# Patient Record
Sex: Female | Born: 1985
Health system: Southern US, Community
[De-identification: ages and names within clinical notes are randomized; demographics above are authoritative.]

## PROBLEM LIST (undated history)

## (undated) DIAGNOSIS — B009 Herpesviral infection, unspecified: Secondary | ICD-10-CM

## (undated) DIAGNOSIS — F419 Anxiety disorder, unspecified: Secondary | ICD-10-CM

## (undated) DIAGNOSIS — F32A Depression, unspecified: Secondary | ICD-10-CM

## (undated) DIAGNOSIS — F329 Major depressive disorder, single episode, unspecified: Secondary | ICD-10-CM

## (undated) DIAGNOSIS — N390 Urinary tract infection, site not specified: Secondary | ICD-10-CM

## (undated) DIAGNOSIS — F319 Bipolar disorder, unspecified: Secondary | ICD-10-CM

## (undated) DIAGNOSIS — T8859XA Other complications of anesthesia, initial encounter: Secondary | ICD-10-CM

## (undated) DIAGNOSIS — K219 Gastro-esophageal reflux disease without esophagitis: Secondary | ICD-10-CM

## (undated) DIAGNOSIS — T4145XA Adverse effect of unspecified anesthetic, initial encounter: Secondary | ICD-10-CM

## (undated) DIAGNOSIS — B354 Tinea corporis: Secondary | ICD-10-CM

## (undated) HISTORY — PX: WRIST SURGERY: SHX841

## (undated) HISTORY — PX: FRACTURE SURGERY: SHX138

## (undated) HISTORY — PX: ABDOMINAL HYSTERECTOMY: SHX81

---

## 2010-06-20 ENCOUNTER — Emergency Department (HOSPITAL_COMMUNITY)
Admission: EM | Admit: 2010-06-20 | Discharge: 2010-06-20 | Payer: Self-pay | Source: Home / Self Care | Admitting: Emergency Medicine

## 2010-06-22 LAB — URINALYSIS, ROUTINE W REFLEX MICROSCOPIC
Bilirubin Urine: NEGATIVE
Ketones, ur: NEGATIVE mg/dL
Nitrite: NEGATIVE
Protein, ur: 100 mg/dL — AB
Specific Gravity, Urine: 1.027 (ref 1.005–1.030)
Urine Glucose, Fasting: NEGATIVE mg/dL
Urobilinogen, UA: 0.2 mg/dL (ref 0.0–1.0)
pH: 5.5 (ref 5.0–8.0)

## 2010-06-22 LAB — URINE MICROSCOPIC-ADD ON

## 2010-06-22 LAB — POCT PREGNANCY, URINE: Preg Test, Ur: NEGATIVE

## 2010-06-27 LAB — URINE CULTURE
Colony Count: >=100000
Culture  Setup Time: 201201161321

## 2010-06-29 ENCOUNTER — Emergency Department (HOSPITAL_COMMUNITY)
Admission: EM | Admit: 2010-06-29 | Discharge: 2010-06-29 | Payer: Self-pay | Source: Home / Self Care | Admitting: Emergency Medicine

## 2010-06-29 LAB — URINALYSIS, ROUTINE W REFLEX MICROSCOPIC
Bilirubin Urine: NEGATIVE
Ketones, ur: NEGATIVE mg/dL
Leukocytes, UA: NEGATIVE
Nitrite: NEGATIVE
Protein, ur: NEGATIVE mg/dL
Specific Gravity, Urine: 1.025 (ref 1.005–1.030)
Urine Glucose, Fasting: NEGATIVE mg/dL
Urobilinogen, UA: 0.2 mg/dL (ref 0.0–1.0)
pH: 6 (ref 5.0–8.0)

## 2010-06-29 LAB — CBC
HCT: 38.4 % (ref 36.0–46.0)
Hemoglobin: 12.9 g/dL (ref 12.0–15.0)
MCH: 31.2 pg (ref 26.0–34.0)
MCHC: 33.6 g/dL (ref 30.0–36.0)
MCV: 93 fL (ref 78.0–100.0)
Platelets: 270 10*3/uL (ref 150–400)
RBC: 4.13 MIL/uL (ref 3.87–5.11)
RDW: 13.3 % (ref 11.5–15.5)
WBC: 12.5 10*3/uL — ABNORMAL HIGH (ref 4.0–10.5)

## 2010-06-29 LAB — COMPREHENSIVE METABOLIC PANEL
ALT: 10 U/L (ref 0–35)
AST: 19 U/L (ref 0–37)
Albumin: 3.7 g/dL (ref 3.5–5.2)
Alkaline Phosphatase: 48 U/L (ref 39–117)
BUN: 9 mg/dL (ref 6–23)
CO2: 28 mEq/L (ref 19–32)
Calcium: 9.5 mg/dL (ref 8.4–10.5)
Chloride: 108 mEq/L (ref 96–112)
Creatinine, Ser: 0.83 mg/dL (ref 0.4–1.2)
GFR calc Af Amer: >60 mL/min (ref >60)
GFR calc non Af Amer: >60 mL/min (ref >60)
Glucose, Bld: 111 mg/dL — ABNORMAL HIGH (ref 70–99)
Potassium: 3.9 mEq/L (ref 3.5–5.1)
Sodium: 141 mEq/L (ref 135–145)
Total Bilirubin: 0.5 mg/dL (ref 0.3–1.2)
Total Protein: 8 g/dL (ref 6.0–8.3)

## 2010-06-29 LAB — LIPASE, BLOOD: Lipase: 30 U/L (ref 11–59)

## 2010-06-29 LAB — POCT PREGNANCY, URINE: Preg Test, Ur: NEGATIVE

## 2010-06-29 LAB — DIFFERENTIAL
Basophils Absolute: 0 10*3/uL (ref 0.0–0.1)
Basophils Relative: 0 % (ref 0–1)
Eosinophils Absolute: 0.1 10*3/uL (ref 0.0–0.7)
Eosinophils Relative: 1 % (ref 0–5)
Lymphocytes Relative: 21 % (ref 12–46)
Lymphs Abs: 2.6 10*3/uL (ref 0.7–4.0)
Monocytes Absolute: 0.9 10*3/uL (ref 0.1–1.0)
Monocytes Relative: 7 % (ref 3–12)
Neutro Abs: 9 10*3/uL — ABNORMAL HIGH (ref 1.7–7.7)
Neutrophils Relative %: 72 % (ref 43–77)

## 2010-06-29 LAB — URINE MICROSCOPIC-ADD ON

## 2010-06-30 LAB — URINE CULTURE
Colony Count: NO GROWTH
Culture  Setup Time: 201201260145
Culture: NO GROWTH

## 2011-01-25 ENCOUNTER — Emergency Department (HOSPITAL_COMMUNITY)
Admission: EM | Admit: 2011-01-25 | Discharge: 2011-01-25 | Disposition: A | Discharge status: Home / Self Care | Payer: Self-pay | Attending: Emergency Medicine | Admitting: Emergency Medicine

## 2011-01-25 DIAGNOSIS — R300 Dysuria: Secondary | ICD-10-CM | POA: Insufficient documentation

## 2011-01-25 DIAGNOSIS — N390 Urinary tract infection, site not specified: Secondary | ICD-10-CM | POA: Insufficient documentation

## 2011-01-25 LAB — URINALYSIS, ROUTINE W REFLEX MICROSCOPIC
Glucose, UA: NEGATIVE mg/dL
Hgb urine dipstick: NEGATIVE
Ketones, ur: 15 mg/dL — AB
Nitrite: POSITIVE — AB
Protein, ur: NEGATIVE mg/dL
Specific Gravity, Urine: 1.016 (ref 1.005–1.030)
Urobilinogen, UA: 4 mg/dL — ABNORMAL HIGH (ref 0.0–1.0)
pH: 5 (ref 5.0–8.0)

## 2011-01-25 LAB — URINE MICROSCOPIC-ADD ON

## 2011-01-25 LAB — BASIC METABOLIC PANEL
BUN: 7 mg/dL (ref 6–23)
CO2: 26 mEq/L (ref 19–32)
Calcium: 9.1 mg/dL (ref 8.4–10.5)
Chloride: 102 mEq/L (ref 96–112)
Creatinine, Ser: 0.73 mg/dL (ref 0.50–1.10)
GFR calc Af Amer: >60 mL/min (ref >60)
GFR calc non Af Amer: >60 mL/min (ref >60)
Glucose, Bld: 92 mg/dL (ref 70–99)
Potassium: 3.6 mEq/L (ref 3.5–5.1)
Sodium: 135 mEq/L (ref 135–145)

## 2011-01-25 LAB — DIFFERENTIAL
Basophils Absolute: 0 10*3/uL (ref 0.0–0.1)
Basophils Relative: 0 % (ref 0–1)
Eosinophils Absolute: 0.1 10*3/uL (ref 0.0–0.7)
Eosinophils Relative: 1 % (ref 0–5)
Lymphocytes Relative: 24 % (ref 12–46)
Lymphs Abs: 1.7 10*3/uL (ref 0.7–4.0)
Monocytes Absolute: 1.1 10*3/uL — ABNORMAL HIGH (ref 0.1–1.0)
Monocytes Relative: 15 % — ABNORMAL HIGH (ref 3–12)
Neutro Abs: 4.2 10*3/uL (ref 1.7–7.7)
Neutrophils Relative %: 60 % (ref 43–77)

## 2011-01-25 LAB — CBC
HCT: 37 % (ref 36.0–46.0)
Hemoglobin: 12.1 g/dL (ref 12.0–15.0)
MCH: 29.9 pg (ref 26.0–34.0)
MCHC: 32.7 g/dL (ref 30.0–36.0)
MCV: 91.4 fL (ref 78.0–100.0)
Platelets: 240 10*3/uL (ref 150–400)
RBC: 4.05 MIL/uL (ref 3.87–5.11)
RDW: 13.7 % (ref 11.5–15.5)
WBC: 7 10*3/uL (ref 4.0–10.5)

## 2011-01-25 LAB — POCT PREGNANCY, URINE: Preg Test, Ur: NEGATIVE

## 2011-02-02 ENCOUNTER — Inpatient Hospital Stay (INDEPENDENT_AMBULATORY_CARE_PROVIDER_SITE_OTHER)
Admission: RE | Admit: 2011-02-02 | Discharge: 2011-02-02 | Disposition: A | Discharge status: Home / Self Care | Payer: Self-pay | Source: Ambulatory Visit | Attending: Emergency Medicine | Admitting: Emergency Medicine

## 2011-02-02 DIAGNOSIS — R599 Enlarged lymph nodes, unspecified: Secondary | ICD-10-CM

## 2011-02-02 DIAGNOSIS — N760 Acute vaginitis: Secondary | ICD-10-CM

## 2011-02-02 DIAGNOSIS — A499 Bacterial infection, unspecified: Secondary | ICD-10-CM

## 2011-02-02 LAB — POCT URINALYSIS DIP (DEVICE)
Glucose, UA: NEGATIVE mg/dL
Hgb urine dipstick: NEGATIVE
Leukocytes, UA: NEGATIVE
Nitrite: NEGATIVE
Protein, ur: NEGATIVE mg/dL
Specific Gravity, Urine: >=1.03 (ref 1.005–1.030)
Urobilinogen, UA: 0.2 mg/dL (ref 0.0–1.0)
pH: 5 (ref 5.0–8.0)

## 2011-02-02 LAB — WET PREP, GENITAL
Trich, Wet Prep: NONE SEEN
Yeast Wet Prep HPF POC: NONE SEEN

## 2011-02-03 LAB — GC/CHLAMYDIA PROBE AMP, GENITAL
Chlamydia, DNA Probe: NEGATIVE
GC Probe Amp, Genital: NEGATIVE

## 2011-02-03 LAB — HIV ANTIBODY (ROUTINE TESTING W REFLEX): HIV: NONREACTIVE

## 2011-02-03 LAB — RPR: RPR Ser Ql: NONREACTIVE

## 2011-02-06 LAB — HERPES SIMPLEX VIRUS CULTURE: Culture: DETECTED

## 2011-07-21 ENCOUNTER — Encounter (HOSPITAL_COMMUNITY): Payer: Self-pay | Admitting: Emergency Medicine

## 2011-07-21 ENCOUNTER — Emergency Department (HOSPITAL_COMMUNITY): Payer: Self-pay

## 2011-07-21 ENCOUNTER — Emergency Department (HOSPITAL_COMMUNITY)
Admission: EM | Admit: 2011-07-21 | Discharge: 2011-07-21 | Disposition: A | Discharge status: Home / Self Care | Payer: Self-pay | Attending: Emergency Medicine | Admitting: Emergency Medicine

## 2011-07-21 DIAGNOSIS — D259 Leiomyoma of uterus, unspecified: Secondary | ICD-10-CM | POA: Insufficient documentation

## 2011-07-21 DIAGNOSIS — N939 Abnormal uterine and vaginal bleeding, unspecified: Secondary | ICD-10-CM

## 2011-07-21 DIAGNOSIS — O208 Other hemorrhage in early pregnancy: Secondary | ICD-10-CM | POA: Insufficient documentation

## 2011-07-21 DIAGNOSIS — O3410 Maternal care for benign tumor of corpus uteri, unspecified trimester: Secondary | ICD-10-CM | POA: Insufficient documentation

## 2011-07-21 DIAGNOSIS — R109 Unspecified abdominal pain: Secondary | ICD-10-CM | POA: Insufficient documentation

## 2011-07-21 LAB — DIFFERENTIAL
Basophils Absolute: 0 10*3/uL (ref 0.0–0.1)
Basophils Relative: 0 % (ref 0–1)
Eosinophils Relative: 1 % (ref 0–5)
Lymphocytes Relative: 25 % (ref 12–46)
Monocytes Absolute: 0.9 10*3/uL (ref 0.1–1.0)

## 2011-07-21 LAB — POCT I-STAT, CHEM 8
BUN: 6 mg/dL (ref 6–23)
Calcium, Ion: 1.25 mmol/L (ref 1.12–1.32)
Chloride: 102 mEq/L (ref 96–112)
Creatinine, Ser: 0.7 mg/dL (ref 0.50–1.10)
Glucose, Bld: 109 mg/dL — ABNORMAL HIGH (ref 70–99)
HCT: 39 % (ref 36.0–46.0)
Hemoglobin: 13.3 g/dL (ref 12.0–15.0)
Potassium: 4.1 mEq/L (ref 3.5–5.1)
Sodium: 139 mEq/L (ref 135–145)
TCO2: 26 mmol/L (ref 0–100)

## 2011-07-21 LAB — URINALYSIS, ROUTINE W REFLEX MICROSCOPIC
Glucose, UA: NEGATIVE mg/dL
Ketones, ur: NEGATIVE mg/dL
Leukocytes, UA: NEGATIVE
Specific Gravity, Urine: 1.01 (ref 1.005–1.030)
pH: 5.5 (ref 5.0–8.0)

## 2011-07-21 LAB — CBC
HCT: 37.6 % (ref 36.0–46.0)
MCHC: 33 g/dL (ref 30.0–36.0)
MCV: 90.8 fL (ref 78.0–100.0)
RDW: 13.9 % (ref 11.5–15.5)

## 2011-07-21 LAB — WET PREP, GENITAL
Trich, Wet Prep: NONE SEEN
Yeast Wet Prep HPF POC: NONE SEEN

## 2011-07-21 MED ORDER — PRENATAL COMPLETE 14-0.4 MG PO TABS: 1.0000 | ORAL_TABLET | Freq: Every day | ORAL | Status: DC

## 2011-07-21 NOTE — ED Notes (Signed)
Called Alexis Meyer to check on status of Alexis Meyer. Reporting another patient ahead of pt

## 2011-07-21 NOTE — ED Notes (Signed)
Pt reporting hungry. EDP reporting pt can eat

## 2011-07-21 NOTE — ED Notes (Signed)
Patient transported to Ultrasound 

## 2011-07-21 NOTE — ED Notes (Signed)
7 week ob and she started to bleeding  today

## 2011-07-21 NOTE — ED Provider Notes (Signed)
History     CSN: [id number]  Arrival date & time 07/21/11  1208   First MD Initiated Contact with Patient 07/21/11 1219      Chief Complaint  Patient presents with  . Vaginal Bleeding    (Consider location/radiation/quality/duration/timing/severity/associated sxs/prior treatment) Patient is a 26 y.o. female presenting with vaginal bleeding. The history is provided by the patient.  Vaginal Bleeding This is a new problem. The current episode started today. The problem occurs constantly. The problem has been unchanged. Associated symptoms include abdominal pain. Pertinent negatives include no chills, fever, nausea, vomiting or weakness. The symptoms are aggravated by nothing.  Pt states she is about [redacted] wks pregnant by home pregnancy test, has not been evaluated yet. States today while sitting upstairs while her father is in surgery, she developed vaginal bleeding. She reports lower abdominal cramping as well, states "feels like my period." Denies any injuries, denies nausea, vomiting, fever, chills, urinary symptoms. No prior pregnancies. Denies any other complaints.   History reviewed. No pertinent past medical history.  Past Surgical History  Procedure Date  . Wrist surgery     No family history on file.  History  Substance Use Topics  . Smoking status: Never Smoker   . Smokeless tobacco: Not on file  . Alcohol Use: No    OB History    Grav Para Term Preterm Abortions TAB SAB Ect Mult Living                  Review of Systems  Constitutional: Negative for fever and chills.  HENT: Negative.   Eyes: Negative.   Respiratory: Negative.   Cardiovascular: Negative.   Gastrointestinal: Positive for abdominal pain. Negative for nausea and vomiting.  Genitourinary: Positive for vaginal bleeding and pelvic pain. Negative for dysuria.  Musculoskeletal: Negative.   Skin: Negative.   Neurological: Negative.  Negative for weakness.  Psychiatric/Behavioral: Negative.      Allergies  Review of patient's allergies indicates no known allergies.  Home Medications  No current outpatient prescriptions on file.  BP 136/67  Pulse 79  Temp(Src) 98.7 F (37.1 C) (Oral)  Resp 14  SpO2 100%  Physical Exam  Constitutional: She is oriented to person, place, and time. She appears well-developed and well-nourished.       tearful  HENT:  Head: Normocephalic and atraumatic.  Eyes: Conjunctivae are normal.  Neck: Neck supple.  Cardiovascular: Normal rate, regular rhythm and normal heart sounds.   Pulmonary/Chest: Effort normal and breath sounds normal. No respiratory distress.  Abdominal: Soft. Bowel sounds are normal. She exhibits no distension. There is no tenderness.  Genitourinary:       Normal external genetalia. Small amount of brown discharge mixed with blood in the vaginal canal. Cervix closed, normal appearing. No  CMT. No adnexal tenderness, no masses palpated  Musculoskeletal: Normal range of motion.  Neurological: She is alert and oriented to person, place, and time.  Skin: Skin is warm.  Psychiatric: She has a normal mood and affect.    ED Course  Procedures (including critical care time) Pt with vaginal bleeding on exam, positive pregnancy, estimated about 7 wks by LMP. Will get [place] to r/o ectopic.  Results for orders placed during the hospital encounter of 07/21/11  CBC      Component Value Range   WBC 10.3  4.0 - 10.5 (K/uL)   RBC 4.14  3.87 - 5.11 (MIL/uL)   Hemoglobin 12.4  12.0 - 15.0 (g/dL)   HCT 82.9  56.2 -  46.0 (%)   MCV 90.8  78.0 - 100.0 (fL)   MCH 30.0  26.0 - 34.0 (pg)   MCHC 33.0  30.0 - 36.0 (g/dL)   RDW 56.2  13.0 - 86.5 (%)   Platelets 253  150 - 400 (K/uL)  DIFFERENTIAL      Component Value Range   Neutrophils Relative 66  43 - 77 (%)   Neutro Abs 6.7  1.7 - 7.7 (K/uL)   Lymphocytes Relative 25  12 - 46 (%)   Lymphs Abs 2.6  0.7 - 4.0 (K/uL)   Monocytes Relative 9  3 - 12 (%)   Monocytes Absolute 0.9  0.1 - 1.0  (K/uL)   Eosinophils Relative 1  0 - 5 (%)   Eosinophils Absolute 0.1  0.0 - 0.7 (K/uL)   Basophils Relative 0  0 - 1 (%)   Basophils Absolute 0.0  0.0 - 0.1 (K/uL)  HCG, QUANTITATIVE, PREGNANCY      Component Value Range   hCG, Beta Chain, Quant, S 8059 (*) <5 (mIU/mL)  ABO/RH      Component Value Range   ABO/RH(D) O POS    PREGNANCY, URINE      Component Value Range   Preg Test, Ur POSITIVE (*) NEGATIVE   URINALYSIS, ROUTINE W REFLEX MICROSCOPIC      Component Value Range   Color, Urine YELLOW  YELLOW    APPearance CLEAR  CLEAR    Specific Gravity, Urine 1.010  1.005 - 1.030    pH 5.5  5.0 - 8.0    Glucose, UA NEGATIVE  NEGATIVE (mg/dL)   Hgb urine dipstick MODERATE (*) NEGATIVE    Bilirubin Urine NEGATIVE  NEGATIVE    Ketones, ur NEGATIVE  NEGATIVE (mg/dL)   Protein, ur NEGATIVE  NEGATIVE (mg/dL)   Urobilinogen, UA 0.2  0.0 - 1.0 (mg/dL)   Nitrite NEGATIVE  NEGATIVE    Leukocytes, UA NEGATIVE  NEGATIVE   WET PREP, GENITAL      Component Value Range   Yeast Wet Prep HPF POC NONE SEEN  NONE SEEN    Trich, Wet Prep NONE SEEN  NONE SEEN    Clue Cells Wet Prep HPF POC NONE SEEN  NONE SEEN    WBC, Wet Prep HPF POC FEW (*) NONE SEEN   POCT I-STAT, CHEM 8      Component Value Range   Sodium 139  135 - 145 (mEq/L)   Potassium 4.1  3.5 - 5.1 (mEq/L)   Chloride 102  96 - 112 (mEq/L)   BUN 6  6 - 23 (mg/dL)   Creatinine, Ser 7.84  0.50 - 1.10 (mg/dL)   Glucose, Bld 696 (*) 70 - 99 (mg/dL)   Calcium, Ion 2.95  2.84 - 1.32 (mmol/L)   TCO2 26  0 - 100 (mmol/L)   Hemoglobin 13.3  12.0 - 15.0 (g/dL)   HCT 13.2  44.0 - 10.2 (%)  URINE MICROSCOPIC-ADD ON      Component Value Range   Squamous Epithelial / LPF FEW (*) RARE    WBC, UA 0-2  <3 (WBC/hpf)   RBC / HPF 0-2  <3 (RBC/hpf)   Bacteria, UA RARE  RARE     Pelvic US pending to r/o ectopic pregnancy. Pt in no distress, VS normal. No pain. Signed out to the next team at shift change  No results found.   No diagnosis  found.    MDM          Myriam Jacobson  [place], [place] 07/22/11 [phone]

## 2011-07-21 NOTE — Discharge Instructions (Signed)
Your ultrasound showed that you are about 6 weeks, 4 days pregnant and the pregnancy is inside the uterus. You did have a fibroid that was seen on the ultrasound which may be causing your bleeding. It is important for you to follow up with your OB/GYN early next week for a recheck - if you are unable to make an appointment with him/her, please see the attached women's health resources. If you are having heavy bleeding, notice vaginal discharge, or are having any other worrisome symptoms, please return to the ER at [facility].  RESOURCE GUIDE  Dental Problems  Patients with Medicaid: [facility] [phone] [address]                                           [phone] [address] Phone:  [phone]                                                  Phone:  [phone]  If unable to pay or uninsured, contact:  [facility] or [facility]. to become qualified for the adult dental clinic.  Chronic Pain Problems Contact [facility]  [phone] Patients need to be referred by their primary care doctor.  Insufficient Money for Medicine Contact United Way:  call "211" or [facility] [phone].  No Primary Care Doctor Call [facility]  [phone] Other agencies that provide inexpensive medical care    [facility]  [phone]    [facility]  [phone]    [facility]  [phone]    [facility]  [phone]    [facility]  [phone]    [facility]  [phone]  Psychological Services [facility]  [phone] [facility]  [phone] [facility]   [phone] (emergency services [phone])  Substance Abuse Resources [facility]  [phone] [facility] [phone] [facility] [phone] Floydene Flock [phone] Residential & Outpatient Substance Abuse Program   [phone]  Abuse/Neglect [facility] Child Abuse Hotline (838) [phone] [facility] Child Abuse Hotline [phone] (After Hours)  Emergency Shelter [facility] [phone]  Maternity Homes [facility] [phone] [facility] ([phone]  MRSA Hotline #:   (276)202-6648    [facility] Resources  [facility]     [employer]                          [facility] [address] [place]                       [address]      [address] [place] [address]  [address]  Cristobal Goldmann Phone:  [phone]                                   Phone:  [phone]                 Phone:  [phone]  [facility] Mental Health Phone:  [phone]  [facility] Child Abuse Hotline (905)114-1828 (306)321-4781 (After Hours)  Fibroids Fibroids are lumps (tumors) that can occur any place in a woman's body. These lumps are not cancerous. Fibroids vary in size, weight, and where they grow. HOME CARE  Do not take aspirin.   Write down the number of pads or tampons you use during your period. Tell your doctor. This can help determine the best treatment for you.  GET HELP RIGHT AWAY IF:  You have pain in your lower belly (abdomen) that is not helped with medicine.   You have cramps that are not helped with medicine.   You have more bleeding between or during your period.   You feel lightheaded or pass out (faint).   Your lower belly pain gets worse.  MAKE SURE YOU:  Understand these instructions.   Will watch your condition.   Will get help right away if you are not doing well or get worse.  Document Released: 06/24/2010 Document Revised: 02/01/2011 Document Reviewed: 06/24/2010 [facility] Patient Information 2012 [place], [place].Fibroids Fibroids are lumps (tumors) that can occur any place in a woman's  body. These lumps are not cancerous. Fibroids vary in size, weight, and where they grow. HOME CARE  Do not take aspirin.   Write down the number of pads or tampons you use during your period. Tell your doctor. This can help determine the best treatment for you.  GET HELP RIGHT AWAY IF:  You have pain in your lower belly (abdomen) that is not helped with medicine.   You have cramps that are not helped with medicine.   You have more bleeding between or during your period.   You feel lightheaded or pass out (faint).   Your lower belly pain gets worse.  MAKE SURE YOU:  Understand these instructions.   Will watch your condition.   Will get help right away if you are not doing well or get worse.  Document Released: 06/24/2010 Document Revised: 02/01/2011 Document Reviewed: 06/24/2010 [facility] Patient Information 2012 [place], [place].

## 2011-07-21 NOTE — ED Provider Notes (Signed)
5:29 PM Signout received from [place], [place]. Pt with vaginal bleeding and confirmed pregnancy in ED today. Her ultrasound shows intrauterine pregnancy at [redacted]w[redacted]d with fundal fibroid which may be the source of her bleeding. No evidence for ectopic or threatened abortion. Findings d/w pt. Will have her f/u with her OB with whom she is already established. Return precautions discussed.  Results for orders placed during the hospital encounter of 07/21/11  CBC      Component Value Range   WBC 10.3  4.0 - 10.5 (K/uL)   RBC 4.14  3.87 - 5.11 (MIL/uL)   Hemoglobin 12.4  12.0 - 15.0 (g/dL)   HCT 16.1  09.6 - 04.5 (%)   MCV 90.8  78.0 - 100.0 (fL)   MCH 30.0  26.0 - 34.0 (pg)   MCHC 33.0  30.0 - 36.0 (g/dL)   RDW 40.9  81.1 - 91.4 (%)   Platelets 253  150 - 400 (K/uL)  DIFFERENTIAL      Component Value Range   Neutrophils Relative 66  43 - 77 (%)   Neutro Abs 6.7  1.7 - 7.7 (K/uL)   Lymphocytes Relative 25  12 - 46 (%)   Lymphs Abs 2.6  0.7 - 4.0 (K/uL)   Monocytes Relative 9  3 - 12 (%)   Monocytes Absolute 0.9  0.1 - 1.0 (K/uL)   Eosinophils Relative 1  0 - 5 (%)   Eosinophils Absolute 0.1  0.0 - 0.7 (K/uL)   Basophils Relative 0  0 - 1 (%)   Basophils Absolute 0.0  0.0 - 0.1 (K/uL)  HCG, QUANTITATIVE, PREGNANCY      Component Value Range   hCG, Beta Chain, Quant, S 8059 (*) <5 (mIU/mL)  ABO/RH      Component Value Range   ABO/RH(D) O POS    PREGNANCY, URINE      Component Value Range   Preg Test, Ur POSITIVE (*) NEGATIVE   URINALYSIS, ROUTINE W REFLEX MICROSCOPIC      Component Value Range   Color, Urine YELLOW  YELLOW    APPearance CLEAR  CLEAR    Specific Gravity, Urine 1.010  1.005 - 1.030    pH 5.5  5.0 - 8.0    Glucose, UA NEGATIVE  NEGATIVE (mg/dL)   Hgb urine dipstick MODERATE (*) NEGATIVE    Bilirubin Urine NEGATIVE  NEGATIVE    Ketones, ur NEGATIVE  NEGATIVE (mg/dL)   Protein, ur NEGATIVE  NEGATIVE (mg/dL)   Urobilinogen, UA 0.2  0.0 - 1.0 (mg/dL)   Nitrite NEGATIVE   NEGATIVE    Leukocytes, UA NEGATIVE  NEGATIVE   WET PREP, GENITAL      Component Value Range   Yeast Wet Prep HPF POC NONE SEEN  NONE SEEN    Trich, Wet Prep NONE SEEN  NONE SEEN    Clue Cells Wet Prep HPF POC NONE SEEN  NONE SEEN    WBC, Wet Prep HPF POC FEW (*) NONE SEEN   POCT I-STAT, CHEM 8      Component Value Range   Sodium 139  135 - 145 (mEq/L)   Potassium 4.1  3.5 - 5.1 (mEq/L)   Chloride 102  96 - 112 (mEq/L)   BUN 6  6 - 23 (mg/dL)   Creatinine, Ser 7.82  0.50 - 1.10 (mg/dL)   Glucose, Bld 956 (*) 70 - 99 (mg/dL)   Calcium, Ion 2.13  0.86 - 1.32 (mmol/L)   TCO2 26  0 - 100 (mmol/L)  Hemoglobin 13.3  12.0 - 15.0 (g/dL)   HCT 16.1  09.6 - 04.5 (%)  URINE MICROSCOPIC-ADD ON      Component Value Range   Squamous Epithelial / LPF FEW (*) RARE    WBC, UA 0-2  <3 (WBC/hpf)   RBC / HPF 0-2  <3 (RBC/hpf)   Bacteria, UA RARE  RARE    US Ob Comp Less 14 Wks  07/21/2011  *RADIOLOGY REPORT*  Clinical Data: Vaginal bleeding.  Evaluate for possible ectopic pregnancy. By LMP, the patient is 7 weeks 2 days. Quantitative beta HCG is 8059.  OBSTETRIC <14 WK [place] AND TRANSVAGINAL OB US  Technique:  Both transabdominal and transvaginal ultrasound examinations were performed for complete evaluation of the gestation as well as the maternal uterus, adnexal regions, and pelvic cul-de-sac.  Transvaginal technique was performed to assess early pregnancy.  Comparison:  CT of the abdomen and pelvis 06/29/2010  Intrauterine gestational sac:  Present Yolk sac: Present Embryo: Not seen Cardiac Activity: Not seen  MSD: 16.9  mm  6    w 4    d       [place] EDC: 03/11/2012  Maternal uterus/adnexae: The right ovary has a normal appearance.  Small hypoechoic area next to the left ovary is 1.1 x 1.2 x 1.1 cm.  Findings are consistent with small para ovarian cyst.  Adjacent to the fundus of the uterus, there is a hypoechoic mass measuring 6.4 x 4.4 x 5.8 cm.  Findings are consistent with a uterine fibroid.  There is  extrinsic effect of the fibroid upon the endometrial canal and gestational sac.  IMPRESSION:  1.  Intrauterine gestational sac. Follow-up ultrasound is suggested in 7-10 days to document presence of fetal pole and for dating purposes. 2.  Fundal fibroid. 3.  Left para ovarian cyst.  Original Report Authenticated By: Patterson Hammersmith, M.D.   US Ob Transvaginal  07/21/2011  *RADIOLOGY REPORT*  Clinical Data: Vaginal bleeding.  Evaluate for possible ectopic pregnancy. By LMP, the patient is 7 weeks 2 days. Quantitative beta HCG is 8059.  OBSTETRIC <14 WK [place] AND TRANSVAGINAL OB US  Technique:  Both transabdominal and transvaginal ultrasound examinations were performed for complete evaluation of the gestation as well as the maternal uterus, adnexal regions, and pelvic cul-de-sac.  Transvaginal technique was performed to assess early pregnancy.  Comparison:  CT of the abdomen and pelvis 06/29/2010  Intrauterine gestational sac:  Present Yolk sac: Present Embryo: Not seen Cardiac Activity: Not seen  MSD: 16.9  mm  6    w 4    d       [place] EDC: 03/11/2012  Maternal uterus/adnexae: The right ovary has a normal appearance.  Small hypoechoic area next to the left ovary is 1.1 x 1.2 x 1.1 cm.  Findings are consistent with small para ovarian cyst.  Adjacent to the fundus of the uterus, there is a hypoechoic mass measuring 6.4 x 4.4 x 5.8 cm.  Findings are consistent with a uterine fibroid.  There is extrinsic effect of the fibroid upon the endometrial canal and gestational sac.  IMPRESSION:  1.  Intrauterine gestational sac. Follow-up ultrasound is suggested in 7-10 days to document presence of fetal pole and for dating purposes. 2.  Fundal fibroid. 3.  Left para ovarian cyst.  Original Report Authenticated By: Patterson Hammersmith, M.D.      [place], [place] 07/21/11 1734

## 2011-07-21 NOTE — ED Notes (Signed)
Pt alert and oriented. NAD. Told to follow up with OB. Notified of Women's Resources at [facility].

## 2011-07-21 NOTE — ED Notes (Signed)
Pt reporting [redacted] weeks pregnant, today had noticed small amount of  vaginal bleeding. Reporting some cramping in lower abdomen. Abdomen is soft, non-tender. Pt unsure whether blood was dark or light or whether clots were present. Reporting today was the first time she experience any bleeding during this pregnancy. This is her first pregnancy.

## 2011-07-22 ENCOUNTER — Inpatient Hospital Stay (HOSPITAL_COMMUNITY)
Admission: AD | Admit: 2011-07-22 | Discharge: 2011-07-22 | Disposition: A | Discharge status: Home / Self Care | Payer: Self-pay | Source: Ambulatory Visit | Attending: Obstetrics & Gynecology | Admitting: Obstetrics & Gynecology

## 2011-07-22 ENCOUNTER — Inpatient Hospital Stay (HOSPITAL_COMMUNITY): Payer: Self-pay

## 2011-07-22 ENCOUNTER — Encounter (HOSPITAL_COMMUNITY): Payer: Self-pay | Admitting: *Deleted

## 2011-07-22 DIAGNOSIS — O200 Threatened abortion: Secondary | ICD-10-CM | POA: Insufficient documentation

## 2011-07-22 HISTORY — DX: Herpesviral infection, unspecified: B00.9

## 2011-07-22 LAB — GC/CHLAMYDIA PROBE AMP, GENITAL
Chlamydia, DNA Probe: NEGATIVE
GC Probe Amp, Genital: NEGATIVE

## 2011-07-22 LAB — HCG, QUANTITATIVE, PREGNANCY: hCG, Beta Chain, Quant, S: 7999 m[IU]/mL — ABNORMAL HIGH (ref <5)

## 2011-07-22 MED ORDER — IBUPROFEN 600 MG PO TABS
600.0000 mg | ORAL_TABLET | Freq: Once | ORAL | Status: AC
  Administered 2011-07-22: 600 mg via ORAL
  Filled 2011-07-22: qty 1

## 2011-07-22 MED ORDER — KETOROLAC TROMETHAMINE 60 MG/2ML IM SOLN
60.0000 mg | Freq: Once | INTRAMUSCULAR | Status: DC
  Filled 2011-07-22: qty 2

## 2011-07-22 NOTE — Discharge Instructions (Signed)
Threatened Miscarriage Bleeding during the first 20 weeks of pregnancy is common. This is sometimes called a threatened miscarriage. This is a pregnancy that is threatening to end before the twentieth week of pregnancy. Often this bleeding stops with bed rest or decreased activities as suggested by your caregiver and the pregnancy continues without any more problems. You may be asked to not have sexual intercourse, have orgasms or use tampons until further notice. Sometimes a threatened miscarriage can progress to a complete or incomplete miscarriage. This may or may not require further treatment. Some miscarriages occur before a woman misses a menstrual period and knows she is pregnant. Miscarriages occur in 15 to 20% of all pregnancies and usually occur during the first 13 weeks of the pregnancy. The exact cause of a miscarriage is usually never known. A miscarriage is natures way of ending a pregnancy that is abnormal or would not make it to term. There are some things that may put you at risk to have a miscarriage, such as:  Hormone problems.   Infection of the uterus or cervix.   Chronic illness, diabetes for example, especially if it is not controlled.   Abnormal shaped uterus.   Fibroids in the uterus.   Incompetent cervix (the cervix is too weak to hold the baby).   Smoking.   Drinking too much alcohol. It's best not to drink any alcohol when you are pregnant.   Taking illegal drugs.  TREATMENT  When a miscarriage becomes complete and all products of conception (all the tissue in the uterus) have been passed, often no treatment is needed. If you think you passed tissue, save it in a container and take it to your doctor for evaluation. If the miscarriage is incomplete (parts of the fetus or placenta remain in the uterus), further treatment may be needed. The most common reason for further treatment is continued bleeding (hemorrhage) because pregnancy tissue did not pass out of the  uterus. This often occurs if a miscarriage is incomplete. Tissue left behind may also become infected. Treatment usually is dilatation and curettage (the removal of the remaining products of pregnancy. This can be done by a simple sucking procedure (suction curettage) or a simple scraping of the inside of the uterus. This may be done in the hospital or in the caregiver's office. This is only done when your caregiver knows that there is no chance for the pregnancy to proceed to term. This is determined by physical examination, negative pregnancy test, falling pregnancy hormone count and/or, an ultrasound revealing a dead fetus. Miscarriages are often a very emotional time for prospective mothers and fathers. This is not you or your partners fault. It did not occur because of an inadequacy in you or your partner. Nearly all miscarriages occur because the pregnancy has started off wrongly. At least half of these pregnancies have a chromosomal abnormality. It is almost always not inherited. Others may have developmental problems with the fetus or placenta. This does not always show up even when the products miscarried are studied under the microscope. The miscarriage is nearly always not your fault and it is not likely that you could have prevented it from happening. If you are having emotional and grieving problems, talk to your health care provider and even seek counseling, if necessary, before getting pregnant again. You can begin trying for another pregnancy as soon as your caregiver says it is OK. HOME CARE INSTRUCTIONS   Your caregiver may order bed rest depending on how much bleeding   and cramping you are having. You may be limited to only getting up to go to the bathroom. You may be allowed to continue light activity. You may need to make arrangements for the care of your other children and for any other responsibilities.   Keep track of the number of pads you use each day, how often you have to change pads  and how saturated (soaked) they are. Record this information.   DO NOT USE TAMPONS. Do not douche, have sexual intercourse or orgasms until approved by your caregiver.   You may receive a follow up appointment for re-evaluation of your pregnancy and a repeat blood test. Re-evaluation often occurs after 2 days and again in 4 to 6 weeks. It is very important that you follow-up in the recommended time period.   If you are Rh negative and the father is Rh positive or you do not know the fathers' blood type, you may receive a shot (Rh immune globulin) to help prevent abnormal antibodies that can develop and affect the baby in any future pregnancies.  SEEK IMMEDIATE MEDICAL CARE IF:  You have severe cramps in your stomach, back, or abdomen.   You have a sudden onset of severe pain in the lower part of your abdomen.   You develop chills.   You run an unexplained temperature of 101 F (38.3 C) or higher.   You pass large clots or tissue. Save any tissue for your caregiver to inspect.   Your bleeding increases or you become light-headed, weak, or have fainting episodes.   You have a gush of fluid from your vagina.   You pass out. This could mean you have a tubal (ectopic) pregnancy.  Document Released: 05/22/2005 Document Revised: 02/01/2011 Document Reviewed: 01/06/2008 [employer] Patient Information 2012 [employer]. 

## 2011-07-22 NOTE — Progress Notes (Signed)
Pt stated she had some vaginal bleeding yesterday. Was seen at [facility] told she had fibroids and IUP. Bleeding is heavier today with large clots and having increased pain and cramping.

## 2011-07-22 NOTE — ED Provider Notes (Addendum)
History     Chief Complaint  Patient presents with  . Vaginal Bleeding   HPI Alexis Meyer 26 y.o. 6w 5d gestation.  Was seen yesterday at [facility] ER for vaginal bleeding.  Had ultrasound yesterday.  Had large fundal fibroid.  Had IUGS and yolk sac.  No fetal pole.  Since yesterday cramping has worsened and bleeding has increased.  Having quarter sized clots now.  OB History    Grav Para Term Preterm Abortions TAB SAB Ect Mult Living   1               Past Medical History  Diagnosis Date  . No pertinent past medical history   . Herpes     Past Surgical History  Procedure Date  . Wrist surgery     No family history on file.  History  Substance Use Topics  . Smoking status: Never Smoker   . Smokeless tobacco: Not on file  . Alcohol Use: No    Allergies: No Known Allergies  Prescriptions prior to admission  Medication Sig Dispense Refill  . Prenatal Vit-Fe Fumarate-FA (PRENATAL COMPLETE) 14-0.4 MG TABS Take 1 tablet by mouth daily.  30 each  3    Review of Systems  Gastrointestinal: Positive for abdominal pain.  Genitourinary:       Vaginal bleeding   Physical Exam   Blood pressure 136/84, pulse 84, temperature 98 F (36.7 C), temperature source Oral, resp. rate 18, height 5' 6.5" (1.689 m), weight 219 lb 3.2 oz (99.428 kg), last menstrual period 05/31/2011.  Physical Exam  Nursing note and vitals reviewed. Constitutional: She is oriented to person, place, and time. She appears well-developed and well-nourished.       Tearful and very worried.  HENT:  Head: Normocephalic.  Eyes: EOM are normal.  Neck: Neck supple.  Musculoskeletal: Normal range of motion.  Neurological: She is alert and oriented to person, place, and time.  Skin: Skin is warm and dry.  Psychiatric: She has a normal mood and affect.    MAU Course  Procedures Care assumed by Sid Falcon, CNM MDM   Assessment and Plan    [name],[name] 07/22/2011, 7:50 PM   Nolene Bernheim,  NP 07/22/11 1954  Nolene Bernheim, NP 07/22/11 2003  Ultrasound Result: IMPRESSION:  1. Single intrauterine gestational sac noted, with an embryo  measuring 3 mm in crown-rump length. The embryo remains too small  to characterize a fetal heartbeat, though the intrauterine  gestational sac is irregular in shape, raising mild concern. The  crown-rump length corresponds to a gestational age of [redacted] weeks 0  days, which does not match the gestational age by LMP, reflecting a  new estimated date of delivery of March 16, 2012.  Would follow up serial quantitative beta HCG levels, in order to  assess for viability, and consider follow-up pelvic ultrasound in 7-  10 days for dating purposes.  2. 6.1 cm fibroid again noted.  3. Small 1.9 cm left paraovarian cyst again seen.  Results for orders placed during the hospital encounter of 07/22/11 (from the past 24 hour(s))  HCG, QUANTITATIVE, PREGNANCY     Status: Abnormal   Collection Time   07/22/11  8:09 PM      Component Value Range   hCG, Beta Chain, Quant, S 7999 (*) <5 (mIU/mL)   A: Threatened Miscarriage  P: Return in 48hr for BHCG Bleeding precautions given  [facility]

## 2011-07-22 NOTE — ED Provider Notes (Signed)
Attestation of Attending Supervision of Advanced Practitioner: Evaluation and management procedures were performed by the PA/NP/CNM/OB Fellow under my supervision/collaboration. Chart reviewed, and agree with management and plan.  Jaynie Collins, M.D. 07/22/2011 8:14 PM

## 2011-07-22 NOTE — ED Provider Notes (Signed)
Attestation of Attending Supervision of Advanced Practitioner: Evaluation and management procedures were performed by the PA/NP/CNM/OB Fellow under my supervision/collaboration. Chart reviewed, and agree with management and plan.  Jaynie Collins, M.D. 07/22/2011 10:08 PM

## 2011-07-24 ENCOUNTER — Inpatient Hospital Stay (HOSPITAL_COMMUNITY)
Admission: AD | Admit: 2011-07-24 | Discharge: 2011-07-24 | Disposition: A | Discharge status: Home / Self Care | Payer: Self-pay | Source: Ambulatory Visit | Attending: Family Medicine | Admitting: Family Medicine

## 2011-07-24 ENCOUNTER — Inpatient Hospital Stay (HOSPITAL_COMMUNITY): Payer: Self-pay

## 2011-07-24 ENCOUNTER — Encounter (HOSPITAL_COMMUNITY): Payer: Self-pay | Admitting: *Deleted

## 2011-07-24 ENCOUNTER — Ambulatory Visit (HOSPITAL_COMMUNITY): Payer: Self-pay

## 2011-07-24 ENCOUNTER — Encounter: Payer: Self-pay | Admitting: Family

## 2011-07-24 DIAGNOSIS — O209 Hemorrhage in early pregnancy, unspecified: Secondary | ICD-10-CM

## 2011-07-24 DIAGNOSIS — D259 Leiomyoma of uterus, unspecified: Secondary | ICD-10-CM

## 2011-07-24 DIAGNOSIS — O039 Complete or unspecified spontaneous abortion without complication: Secondary | ICD-10-CM | POA: Insufficient documentation

## 2011-07-24 DIAGNOSIS — O0281 Inappropriate change in quantitative human chorionic gonadotropin (hCG) in early pregnancy: Secondary | ICD-10-CM

## 2011-07-24 HISTORY — DX: Urinary tract infection, site not specified: N39.0

## 2011-07-24 LAB — HCG, QUANTITATIVE, PREGNANCY: hCG, Beta Chain, Quant, S: 1794 m[IU]/mL — ABNORMAL HIGH (ref <5)

## 2011-07-24 MED ORDER — IBUPROFEN 600 MG PO TABS: 600.0000 mg | ORAL_TABLET | Freq: Four times a day (QID) | ORAL | Status: AC | PRN

## 2011-07-24 NOTE — Discharge Instructions (Signed)
Miscarriage An early pregnancy loss or spontaneous abortion (miscarriage) is a common problem. This usually happens when the pregnancy is not developing normally. It is very unlikely that you or your partner did anything to cause this, although cigarette smoking, a sexually transmitted disease, excessive alcohol use, or drug abuse can increase the risk. Other causes are:  Abnormalities of the uterus.   Hormone or medical problems.   Trauma or genetic (chromosome) problems.  Having a miscarriage does not change your chances of having a normal pregnancy in the future. Your caregiver will advise you when it is safe to try to get pregnant again. AFTER A MISCARRIAGE  A miscarriage is inevitable when there is continual, heavy vaginal bleeding; cramping; dilation of the cervix; or passing of any pregnancy tissue. Bleeding and cramping will usually continue until all the tissue has been removed from the womb (uterus).   Often the uterus does not clean itself out completely. A medication or a D&C procedure is needed to loosen or remove the pregnancy tissue from the uterus. A D&C scrapes or suctions the tissue out.   If you are RH negative, you may need to have Rh immune globulin to avoid Rh problems.   You may be given medication to fight an infection if the miscarriage was due to an infection.  HOME CARE INSTRUCTIONS   You should rest in bed for the next 2 to 3 days.   Do not take tub baths or put anything in your vagina, including tampons or a douche.   Do not have sex until your caregiver approves.   Avoid exercise or heavy activities until directed by your caregiver.   Save any vaginal discharge that looks like tissue. Ask your caregiver if he or she wants to inspect the discharge.   If you and your partner are having problems with guilt or grieving, talk to your caregiver or get counseling to help you understand and cope with your pregnancy loss.   Allow enough time to grieve before  trying to get pregnant again.  SEEK IMMEDIATE MEDICAL CARE IF:   You have persistent heavy bleeding or a bad smelling vaginal discharge.   You have continued abdominal or pelvic pain.   You have an oral temperature above 102 F (38.9 C), not controlled by medicine.   You have severe weakness, fainting, or keep throwing up (vomiting).   You develop chills.   You are experiencing domestic violence.  MAKE SURE YOU:   Understand these instructions.   Will watch your condition.   Will get help right away if you are not doing well or get worse.  Document Released: 06/29/2004 Document Revised: 02/01/2011 Document Reviewed: 05/14/2008 [facility] Patient Information 2012 [place], [place].Miscarriage An early pregnancy loss or spontaneous abortion (miscarriage) is a common problem. This usually happens when the pregnancy is not developing normally. It is very unlikely that you or your partner did anything to cause this, although cigarette smoking, a sexually transmitted disease, excessive alcohol use, or drug abuse can increase the risk. Other causes are:  Abnormalities of the uterus.   Hormone or medical problems.   Trauma or genetic (chromosome) problems.  Having a miscarriage does not change your chances of having a normal pregnancy in the future. Your caregiver will advise you when it is safe to try to get pregnant again. AFTER A MISCARRIAGE  A miscarriage is inevitable when there is continual, heavy vaginal bleeding; cramping; dilation of the cervix; or passing of any pregnancy tissue. Bleeding and cramping  will usually continue until all the tissue has been removed from the womb (uterus).   Often the uterus does not clean itself out completely. A medication or a D&C procedure is needed to loosen or remove the pregnancy tissue from the uterus. A D&C scrapes or suctions the tissue out.   If you are RH negative, you may need to have Rh immune globulin to avoid Rh problems.   You may be  given medication to fight an infection if the miscarriage was due to an infection.  HOME CARE INSTRUCTIONS   You should rest in bed for the next 2 to 3 days.   Do not take tub baths or put anything in your vagina, including tampons or a douche.   Do not have sex until your caregiver approves.   Avoid exercise or heavy activities until directed by your caregiver.   Save any vaginal discharge that looks like tissue. Ask your caregiver if he or she wants to inspect the discharge.   If you and your partner are having problems with guilt or grieving, talk to your caregiver or get counseling to help you understand and cope with your pregnancy loss.   Allow enough time to grieve before trying to get pregnant again.  SEEK IMMEDIATE MEDICAL CARE IF:   You have persistent heavy bleeding or a bad smelling vaginal discharge.   You have continued abdominal or pelvic pain.   You have an oral temperature above 102 F (38.9 C), not controlled by medicine.   You have severe weakness, fainting, or keep throwing up (vomiting).   You develop chills.   You are experiencing domestic violence.  MAKE SURE YOU:   Understand these instructions.   Will watch your condition.   Will get help right away if you are not doing well or get worse.  Document Released: 06/29/2004 Document Revised: 02/01/2011 Document Reviewed: 05/14/2008 [facility] Patient Information 2012 [place], [place].

## 2011-07-24 NOTE — ED Notes (Signed)
Pt very upset while talking with [place] CNM, has had many family losses. requests to go to a rm while waiting for [place].

## 2011-07-24 NOTE — Progress Notes (Signed)
Light bleeding, no longer passing clots.  Mild cramping.

## 2011-07-24 NOTE — ED Provider Notes (Signed)
History   Chief Complaint:  Follow-up   Alexis Meyer is  26 y.o. G1P0 Patient's last menstrual period was 05/31/2011. Patient is here for follow up of quantitative HCG and ongoing surveillance of pregnancy status.   She is [redacted]w[redacted]d weeks gestation  by LMP.    Since her last visit, the patient reports heavy bleeding and passing clots after her last visit.   The patient reports bleeding as spotting now.  She denies abd pain.  General ROS: Otherwise  negative  Her previous Quantitative HCG values are:  Results for Alexis, Meyer (MRN [id number]) as of 07/24/2011 13:57  Ref. Range 07/21/2011 13:02 07/22/2011 20:09 07/24/2011 11:30  hCG, Beta Chain, Quant, S Latest Range: <5 mIU/mL 8059 (H) 7999 (H) 1794 (H)    Physical Exam  Pt very tearful. Support person present.  Blood pressure 146/78, pulse 100, temperature 98.7 F (37.1 C), temperature source Oral, resp. rate 20, last menstrual period 05/31/2011. Patient Vitals for the past 24 hrs:  BP Temp Temp src Pulse Resp  07/24/11 1422 107/61 mmHg 99.4 F (37.4 C) Oral 79  20   07/24/11 1217 146/78 mmHg 98.7 F (37.1 C) Oral 100  20     Focused Gynecological Exam: exam declined by the patient  Labs: Recent Results (from the past 24 hour(s))  HCG, QUANTITATIVE, PREGNANCY   Collection Time   07/24/11 11:30 AM      Component Value Range   hCG, Beta Chain, Quant, S 1794 (*) <5 (mIU/mL)   Ultrasound Studies:   US shows no GS or evidence of retained POC.  Assessment:  Completed SAB, bleeding stable   Plan: The patient is instructed to follow up in MAU PRN for heavy bleeding or fever. Support given F/U in Gyn clinic in 4 weeks  Milton, Koleen Nimrod 07/24/2011, 12:25 PM

## 2011-07-25 NOTE — ED Provider Notes (Signed)
Chart reviewed and agree with management and plan.  

## 2011-07-27 NOTE — ED Provider Notes (Signed)
Medical screening examination/treatment/procedure(s) were performed by non-physician practitioner and as supervising physician I was immediately available for consultation/collaboration.  Mauri Temkin, MD 07/27/11 0817 

## 2011-07-27 NOTE — ED Provider Notes (Signed)
Medical screening examination/treatment/procedure(s) were performed by non-physician practitioner and as supervising physician I was immediately available for consultation/collaboration.  Betta Balla, MD 07/27/11 0817 

## 2011-08-17 ENCOUNTER — Encounter: Payer: Self-pay | Admitting: Family

## 2012-07-11 ENCOUNTER — Encounter (HOSPITAL_COMMUNITY): Payer: Self-pay | Admitting: Cardiology

## 2012-07-11 ENCOUNTER — Emergency Department (HOSPITAL_COMMUNITY)
Admission: EM | Admit: 2012-07-11 | Discharge: 2012-07-11 | Disposition: A | Discharge status: Home / Self Care | Payer: Self-pay | Attending: Emergency Medicine | Admitting: Emergency Medicine

## 2012-07-11 DIAGNOSIS — N96 Recurrent pregnancy loss: Secondary | ICD-10-CM | POA: Insufficient documentation

## 2012-07-11 DIAGNOSIS — Z3202 Encounter for pregnancy test, result negative: Secondary | ICD-10-CM | POA: Insufficient documentation

## 2012-07-11 DIAGNOSIS — R51 Headache: Secondary | ICD-10-CM | POA: Insufficient documentation

## 2012-07-11 DIAGNOSIS — N938 Other specified abnormal uterine and vaginal bleeding: Secondary | ICD-10-CM | POA: Insufficient documentation

## 2012-07-11 DIAGNOSIS — R109 Unspecified abdominal pain: Secondary | ICD-10-CM | POA: Insufficient documentation

## 2012-07-11 DIAGNOSIS — N939 Abnormal uterine and vaginal bleeding, unspecified: Secondary | ICD-10-CM

## 2012-07-11 DIAGNOSIS — N949 Unspecified condition associated with female genital organs and menstrual cycle: Secondary | ICD-10-CM | POA: Insufficient documentation

## 2012-07-11 DIAGNOSIS — B009 Herpesviral infection, unspecified: Secondary | ICD-10-CM | POA: Insufficient documentation

## 2012-07-11 DIAGNOSIS — Z87440 Personal history of urinary (tract) infections: Secondary | ICD-10-CM | POA: Insufficient documentation

## 2012-07-11 LAB — URINALYSIS, ROUTINE W REFLEX MICROSCOPIC
Hgb urine dipstick: NEGATIVE
Nitrite: NEGATIVE
Protein, ur: NEGATIVE mg/dL
Specific Gravity, Urine: 1.029 (ref 1.005–1.030)
Urobilinogen, UA: 0.2 mg/dL (ref 0.0–1.0)

## 2012-07-11 LAB — POCT PREGNANCY, URINE: Preg Test, Ur: NEGATIVE

## 2012-07-11 MED ORDER — NAPROXEN 500 MG PO TABS: 500.0000 mg | ORAL_TABLET | Freq: Two times a day (BID) | ORAL | Status: DC

## 2012-07-11 MED ORDER — ACETAMINOPHEN 500 MG PO TABS
1000.0000 mg | ORAL_TABLET | Freq: Once | ORAL | Status: AC
  Administered 2012-07-11: 1000 mg via ORAL
  Filled 2012-07-11: qty 2

## 2012-07-11 NOTE — ED Notes (Signed)
Pt refuses Tylenol until pregnancy test results

## 2012-07-11 NOTE — ED Provider Notes (Signed)
History     CSN: [id number]  Arrival date & time 07/11/12  1615   First MD Initiated Contact with Patient 07/11/12 1803      Chief Complaint  Patient presents with  . Headache  . Vaginal Bleeding    (Consider location/radiation/quality/duration/timing/severity/associated sxs/prior treatment) HPI Comments: 27 year old female who has been pregnant twice in the past both culminating in a miscarriage prior to the three-month mark who presents with the complaint of vaginal bleeding for the last 7 days, it is a very small amount of spotting but associated with lower abdominal cramping. She has been fairly regular with her cycles, this is abnormal for her to be bleeding as long and is early. She is unsure she is pregnant but is having unprotected sex with her boyfriend. She denies diarrhea, dysuria, fevers chills nausea or vomiting. The symptoms are persistent, nothing seems to make it better or worse, she does not take anticoagulants.  Patient is a 27 y.o. female presenting with headaches and vaginal bleeding. The history is provided by the patient.  Headache   Vaginal Bleeding Associated symptoms include headaches.    Past Medical History  Diagnosis Date  . Herpes   . Urinary tract infection     Past Surgical History  Procedure Date  . Wrist surgery   . Fracture surgery     left wrist    Family History  Problem Relation Age of Onset  . Adopted: Yes    History  Substance Use Topics  . Smoking status: Never Smoker   . Smokeless tobacco: Not on file  . Alcohol Use: No    OB History    Grav Para Term Preterm Abortions TAB SAB Ect Mult Living   1               Review of Systems  Genitourinary: Positive for vaginal bleeding.  Neurological: Positive for headaches.  All other systems reviewed and are negative.    Allergies  Review of patient's allergies indicates no known allergies.  Home Medications   Current Outpatient Rx  Name  Route  Sig  Dispense  Refill   . PRESCRIPTION MEDICATION   Oral   Take 1 tablet by mouth daily. Not filled at [facility], [facility] or [facility] - medicine for bipolar         . PRESCRIPTION MEDICATION   Oral   Take 1 tablet by mouth every evening. Not filled at [facility], [facility] or [facility] - medicine for bipolar         . NAPROXEN 500 MG PO TABS   Oral   Take 1 tablet (500 mg total) by mouth 2 (two) times daily with a meal.   30 tablet   0     BP 151/98  Pulse 85  Temp 97.9 F (36.6 C) (Oral)  Resp 18  SpO2 100%  LMP 06/24/2012  Breastfeeding? Unknown  Physical Exam  Nursing note and vitals reviewed. Constitutional: She appears well-developed and well-nourished. No distress.  HENT:  Head: Normocephalic and atraumatic.  Mouth/Throat: Oropharynx is clear and moist. No oropharyngeal exudate.  Eyes: Conjunctivae normal and EOM are normal. Pupils are equal, round, and reactive to light. Right eye exhibits no discharge. Left eye exhibits no discharge. No scleral icterus.  Neck: Normal range of motion. Neck supple. No JVD present. No thyromegaly present.  Cardiovascular: Normal rate, regular rhythm, normal heart sounds and intact distal pulses.  Exam reveals no gallop and no friction rub.   No murmur heard. Pulmonary/Chest:  Effort normal and breath sounds normal. No respiratory distress. She has no wheezes. She has no rales.  Abdominal: Soft. Bowel sounds are normal. She exhibits no distension and no mass. There is no tenderness.  Musculoskeletal: Normal range of motion. She exhibits no edema and no tenderness.  Lymphadenopathy:    She has no cervical adenopathy.  Neurological: She is alert. Coordination normal.  Skin: Skin is warm and dry. No rash noted. No erythema.  Psychiatric: She has a normal mood and affect. Her behavior is normal.    ED Course  Procedures (including critical care time)   Labs Reviewed  URINALYSIS, ROUTINE W REFLEX MICROSCOPIC  POCT PREGNANCY, URINE   No results found.   1.  Abnormal vaginal bleeding       MDM  Possible pregnancy, when the urine pregnancy, urinalysis, if not consider anovulatory cycle, vital signs stable. Tylenol ordered for pain  Pregnancy negative, vital signs remain normal, patient likely having anovulatory cycle or other source of dysfunctional uterine bleeding but is benign. Stable for discharge, and laboratory, requesting discharge at this time and has declined pelvic exam.      Vida Roller, MD 07/11/12 [id number]

## 2012-07-11 NOTE — ED Notes (Signed)
Pt reports a headache for the past 2 days. States she had her normal cycle about 3 weeks ago, but recently had some abnormal spotting and abd cramping. Is concerned and wants to be "checked out".

## 2013-02-08 ENCOUNTER — Encounter (HOSPITAL_COMMUNITY): Payer: Self-pay | Admitting: Emergency Medicine

## 2013-02-08 ENCOUNTER — Emergency Department (HOSPITAL_COMMUNITY)
Admission: EM | Admit: 2013-02-08 | Discharge: 2013-02-08 | Disposition: A | Discharge status: Home / Self Care | Payer: 59 | Attending: Emergency Medicine | Admitting: Emergency Medicine

## 2013-02-08 DIAGNOSIS — Z8619 Personal history of other infectious and parasitic diseases: Secondary | ICD-10-CM | POA: Insufficient documentation

## 2013-02-08 DIAGNOSIS — Z3202 Encounter for pregnancy test, result negative: Secondary | ICD-10-CM | POA: Insufficient documentation

## 2013-02-08 DIAGNOSIS — R300 Dysuria: Secondary | ICD-10-CM | POA: Insufficient documentation

## 2013-02-08 DIAGNOSIS — Z79899 Other long term (current) drug therapy: Secondary | ICD-10-CM | POA: Insufficient documentation

## 2013-02-08 DIAGNOSIS — N390 Urinary tract infection, site not specified: Secondary | ICD-10-CM | POA: Insufficient documentation

## 2013-02-08 DIAGNOSIS — R3915 Urgency of urination: Secondary | ICD-10-CM | POA: Insufficient documentation

## 2013-02-08 LAB — URINALYSIS, ROUTINE W REFLEX MICROSCOPIC
Bilirubin Urine: NEGATIVE
Ketones, ur: NEGATIVE mg/dL
Nitrite: NEGATIVE
Protein, ur: NEGATIVE mg/dL
pH: 5.5 (ref 5.0–8.0)

## 2013-02-08 LAB — URINE MICROSCOPIC-ADD ON

## 2013-02-08 MED ORDER — PHENAZOPYRIDINE HCL 200 MG PO TABS: 200.0000 mg | ORAL_TABLET | Freq: Three times a day (TID) | ORAL | Status: DC

## 2013-02-08 MED ORDER — NITROFURANTOIN MONOHYD MACRO 100 MG PO CAPS: 100.0000 mg | ORAL_CAPSULE | Freq: Two times a day (BID) | ORAL | Status: DC

## 2013-02-08 NOTE — ED Provider Notes (Signed)
CSN: [id number]     Arrival date & time 02/08/13  1158 History   First MD Initiated Contact with Patient 02/08/13 1210     Chief Complaint  Patient presents with  . Urinary Frequency   (Consider location/radiation/quality/duration/timing/severity/associated sxs/prior Treatment) HPI Comments: Patient presents with a chief complaint of dysuria, increased urinary frequency, urgency, and suprapubic pressure with urination.  Symptoms have been present for the past week and are gradually worsening.  She reports that symptoms feel similar to when she has had a UTI in the past.  She reports that she has taken Cranberry pills, and has had mild relief.  She denies flank pain, fever, chills, nausea, or vomiting.  Denies vaginal discharge.    Patient is a 27 y.o. female presenting with frequency. The history is provided by the patient.  Urinary Frequency    Past Medical History  Diagnosis Date  . Herpes   . Urinary tract infection    Past Surgical History  Procedure Laterality Date  . Wrist surgery    . Fracture surgery      left wrist   Family History  Problem Relation Age of Onset  . Adopted: Yes   History  Substance Use Topics  . Smoking status: Never Smoker   . Smokeless tobacco: Not on file  . Alcohol Use: No   OB History   Grav Para Term Preterm Abortions TAB SAB Ect Mult Living   1              Review of Systems  Genitourinary: Positive for dysuria, urgency and frequency. Negative for vaginal discharge.  All other systems reviewed and are negative.    Allergies  Review of patient's allergies indicates no known allergies.  Home Medications   Current Outpatient Rx  Name  Route  Sig  Dispense  Refill  . Cranberry 450 MG CAPS   Oral   Take 450 mg by mouth daily.          BP 126/67  Pulse 83  Temp(Src) 98.2 F (36.8 C) (Oral)  Resp 18  SpO2 100% Physical Exam  Nursing note and vitals reviewed. Constitutional: She appears well-developed and well-nourished.   HENT:  Head: Normocephalic and atraumatic.  Cardiovascular: Normal rate, regular rhythm and normal heart sounds.   Pulmonary/Chest: Effort normal and breath sounds normal.  Abdominal: Soft. Normal appearance and bowel sounds are normal. She exhibits no distension and no mass. There is no tenderness. There is no rebound, no guarding and no CVA tenderness.  Neurological: She is alert.  Skin: Skin is warm and dry.  Psychiatric: She has a normal mood and affect.    ED Course  Procedures (including critical care time) Labs Review Labs Reviewed  URINALYSIS, ROUTINE W REFLEX MICROSCOPIC - Abnormal; Notable for the following:    APPearance CLOUDY (*)    Hgb urine dipstick TRACE (*)    Leukocytes, UA SMALL (*)    All other components within normal limits  URINE MICROSCOPIC-ADD ON - Abnormal; Notable for the following:    Bacteria, UA MANY (*)    All other components within normal limits  URINE CULTURE  POCT PREGNANCY, URINE   Imaging Review No results found.  MDM  No diagnosis found. Patient presenting with urinary symptoms.  UA showing UTI.  Urine sent for culture.  No flank pain, nausea, vomiting, fever, or chills to suggest Pyelonephritis.  Patient given prescription for Macrobid.  Patient stable for discharge.  Return precautions given.    Pascal Lux  Maralyn Sago, PA-C 02/08/13 1553

## 2013-02-08 NOTE — ED Notes (Signed)
Pt states that she has been having frequency of urination and pressure x 1 wk.

## 2013-02-10 LAB — URINE CULTURE: Colony Count: >=100000

## 2013-02-11 NOTE — Progress Notes (Signed)
ED Antimicrobial Stewardship Positive Culture Follow Up   Alexis Meyer is an 26 y.o. female who presented to [facility] on 02/08/2013 with a chief complaint of  Chief Complaint  Patient presents with  . Urinary Frequency    Recent Results (from the past 720 hour(s))  URINE CULTURE     Status: None   Collection Time    02/08/13 12:20 PM      Result Value Range Status   Specimen Description URINE, CLEAN CATCH   Final   Special Requests NONE   Final   Culture  Setup Time     Final   Value: 02/08/2013 18:40     Performed at [facility] Count     Final   Value: >=100,000 COLONIES/ML     Performed at [facility]   Culture     Final   Value: ENTEROBACTER CLOACAE     Performed at [facility]   Report Status 02/10/2013 FINAL   Final   Organism ID, Bacteria ENTEROBACTER CLOACAE   Final    [x]  Treated with Macrobid, organism resistant to prescribed antimicrobial []  Patient discharged originally without antimicrobial agent and treatment is now indicated  New antibiotic prescription: Ciprofloxacin 500mg  PO BID x 3 days  ED Provider: Rhea Bleacher, PA-C   [name] 02/11/2013, 4:40 PM Infectious Diseases Pharmacist Phone# [phone]

## 2013-02-11 NOTE — ED Notes (Signed)
Post ED Visit - Positive Culture Follow-up: Successful Patient Follow-Up  Culture assessed and recommendations reviewed by: [x]  Wes Dulaney, Pharm.D., BCPS []  Celedonio Miyamoto, Pharm.D., BCPS []  Georgina Pillion, [address].D., BCPS []  [place], [address].D., BCPS, AAHIVP []  Estella Husk, Pharm.D., BCPS, AAHIVP  Positive urine culture  []  Patient discharged without antimicrobial prescription and treatment is now indicated [x]  Organism is resistant to prescribed ED discharge antimicrobial []  Patient with positive blood cultures  Changes discussed with ED provider: Rhea Bleacher New antibiotic prescription Stop Macrobid Start Cipro 500 mg po BID x 3 days  Larena Sox 02/11/2013, 1:23 PM

## 2013-02-12 NOTE — ED Provider Notes (Signed)
Medical screening examination/treatment/procedure(s) were performed by non-physician practitioner and as supervising physician I was immediately available for consultation/collaboration.  Yasira Engelson R. Kamoni Gentles, MD 02/12/13 1531 

## 2013-09-25 ENCOUNTER — Ambulatory Visit: Payer: Self-pay | Admitting: Family Medicine

## 2013-10-29 ENCOUNTER — Encounter: Payer: Self-pay | Admitting: Family Medicine

## 2013-10-29 ENCOUNTER — Ambulatory Visit (INDEPENDENT_AMBULATORY_CARE_PROVIDER_SITE_OTHER): Payer: 59 | Admitting: Family Medicine

## 2013-10-29 VITALS — BP 134/86 | HR 90 | Temp 98.3°F | Ht 66.0 in | Wt 234.0 lb

## 2013-10-29 DIAGNOSIS — F3181 Bipolar II disorder: Secondary | ICD-10-CM | POA: Insufficient documentation

## 2013-10-29 DIAGNOSIS — Z0000 Encounter for general adult medical examination without abnormal findings: Secondary | ICD-10-CM

## 2013-10-29 DIAGNOSIS — F3189 Other bipolar disorder: Secondary | ICD-10-CM

## 2013-10-29 MED ORDER — QUETIAPINE FUMARATE 100 MG PO TABS: 100.0000 mg | ORAL_TABLET | Freq: Every day | ORAL | Status: DC

## 2013-10-29 NOTE — Assessment & Plan Note (Addendum)
A: amenable to starting medications today; currently in depressive episode, does not meet criteria for type I mania  Precepted with Dr. Gwenlyn Saran. Pt may ultimately benefit from latuda. Concern that binge eating may get worse with seroquel (will have to monitor closely) No thoughts of self harm currently, not at danger to herself or others P: seroquel 50mg  with uptitration to 300mg  qhs -reviewed red flags and reasons to RTC -pt to return to clinic in 1 weeks time  -plan to attempt to ultimately get in with mood disorder clinic -needs close follow up
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
## 2013-10-29 NOTE — Patient Instructions (Signed)
Alexis Meyer, it was great to meet you today!  I am sorry to hear that things are not going well for you lately. I think it would be a good idea to start a medication called seroquel. We will start at a low dose and titrate up slowly For the first day take 1/2 a tab For the second day take 1 tab For the third day take 2 tabs For the fourth day take 3 tabs After the fourth day take 3 tabs daily.  Take your medication at night time, as you may experience sleepiness in the morning. Be mindful of your food choices as you take this medication. Please call with any concerns whatsoever Schedule an appointment to see me in 1 week.   Looking forward to seeing you soon Bernadene Bell, MD

## 2013-11-07 ENCOUNTER — Ambulatory Visit: Payer: 59 | Admitting: Family Medicine

## 2013-11-21 ENCOUNTER — Ambulatory Visit: Payer: 59 | Admitting: Family Medicine

## 2014-03-29 ENCOUNTER — Encounter (HOSPITAL_COMMUNITY): Payer: Self-pay | Admitting: Emergency Medicine

## 2014-03-29 ENCOUNTER — Emergency Department (HOSPITAL_COMMUNITY): Payer: 59

## 2014-03-29 ENCOUNTER — Emergency Department (HOSPITAL_COMMUNITY)
Admission: EM | Admit: 2014-03-29 | Discharge: 2014-03-29 | Disposition: A | Discharge status: Home / Self Care | Payer: 59 | Attending: Emergency Medicine | Admitting: Emergency Medicine

## 2014-03-29 DIAGNOSIS — Z8619 Personal history of other infectious and parasitic diseases: Secondary | ICD-10-CM | POA: Insufficient documentation

## 2014-03-29 DIAGNOSIS — Z79899 Other long term (current) drug therapy: Secondary | ICD-10-CM | POA: Diagnosis not present

## 2014-03-29 DIAGNOSIS — N390 Urinary tract infection, site not specified: Secondary | ICD-10-CM | POA: Diagnosis not present

## 2014-03-29 DIAGNOSIS — Z792 Long term (current) use of antibiotics: Secondary | ICD-10-CM | POA: Diagnosis not present

## 2014-03-29 DIAGNOSIS — S61212A Laceration without foreign body of right middle finger without damage to nail, initial encounter: Secondary | ICD-10-CM | POA: Diagnosis not present

## 2014-03-29 DIAGNOSIS — Y9389 Activity, other specified: Secondary | ICD-10-CM | POA: Diagnosis not present

## 2014-03-29 DIAGNOSIS — S6721XA Crushing injury of right hand, initial encounter: Secondary | ICD-10-CM | POA: Diagnosis not present

## 2014-03-29 DIAGNOSIS — Y9289 Other specified places as the place of occurrence of the external cause: Secondary | ICD-10-CM | POA: Diagnosis not present

## 2014-03-29 DIAGNOSIS — W458XXA Other foreign body or object entering through skin, initial encounter: Secondary | ICD-10-CM | POA: Diagnosis not present

## 2014-03-29 DIAGNOSIS — T1490XA Injury, unspecified, initial encounter: Secondary | ICD-10-CM

## 2014-03-29 DIAGNOSIS — S6991XA Unspecified injury of right wrist, hand and finger(s), initial encounter: Secondary | ICD-10-CM | POA: Diagnosis present

## 2014-03-29 MED ORDER — IBUPROFEN 600 MG PO TABS: 600.0000 mg | ORAL_TABLET | Freq: Four times a day (QID) | ORAL | Status: DC | PRN

## 2014-03-29 MED ORDER — BACITRACIN ZINC 500 UNIT/GM EX OINT
1.0000 "application " | TOPICAL_OINTMENT | Freq: Two times a day (BID) | CUTANEOUS | Status: DC
  Administered 2014-03-29: 1 via TOPICAL

## 2014-03-29 MED ORDER — IBUPROFEN 400 MG PO TABS
600.0000 mg | ORAL_TABLET | Freq: Once | ORAL | Status: AC
  Administered 2014-03-29: 600 mg via ORAL
  Filled 2014-03-29 (×2): qty 1

## 2014-03-29 MED ORDER — BACITRACIN 500 UNIT/GM EX OINT
1.0000 "application " | TOPICAL_OINTMENT | Freq: Two times a day (BID) | CUTANEOUS | Status: DC
  Filled 2014-03-29 (×18): qty 14

## 2014-03-29 MED ORDER — BACITRACIN 500 UNIT/GM EX OINT
1.0000 "application " | TOPICAL_OINTMENT | Freq: Two times a day (BID) | CUTANEOUS | Status: DC
  Filled 2014-03-29 (×2): qty 0.9

## 2014-03-29 NOTE — ED Provider Notes (Signed)
CSN: [id number]     Arrival date & time 03/29/14  0827 History  This chart was scribed for Carlisle Cater, PA-C, working with Blanchie Dessert, MD by Steva Colder, ED Scribe. The patient was seen in room TR08C/TR08C at 9:00 AM.     Chief Complaint  Patient presents with  . Hand Injury    The history is provided by the patient. No language interpreter was used.   Maciah Feeback is a 28 y.o. female who presents today complaining of a hand injury onset PTA. She states that she was putting items into the laundry chute. She states that the door closed on her fingers and she was trying to open the door and it wouldn't open. She states that she had to pull her fingers free. She states that she does not think that it is broken. She denies associated symptoms of numbness, joint swelling. She denies any other symptoms. She states that she works for [employer].     Past Medical History  Diagnosis Date  . Herpes   . Urinary tract infection    Past Surgical History  Procedure Laterality Date  . Wrist surgery    . Fracture surgery      left wrist   Family History  Problem Relation Age of Onset  . Adopted: Yes  . Cancer Mother   . Cancer Father    History  Substance Use Topics  . Smoking status: Never Smoker   . Smokeless tobacco: Not on file  . Alcohol Use: 0.6 oz/week    1 Cans of beer per week   OB History   Grav Para Term Preterm Abortions TAB SAB Ect Mult Living   1              Review of Systems  Constitutional: Negative for activity change.  Musculoskeletal: Positive for arthralgias and joint swelling (right middle finger). Negative for back pain and neck pain.  Skin: Positive for wound (laceration to the right middle finger).  Neurological: Negative for weakness and numbness.      Allergies  Review of patient's allergies indicates no known allergies.  Home Medications   Prior to Admission medications   Medication Sig Start Date End Date  Taking? Authorizing Provider  Cranberry 450 MG CAPS Take 450 mg by mouth daily.    Historical Provider, MD  nitrofurantoin, macrocrystal-monohydrate, (MACROBID) 100 MG capsule Take 1 capsule (100 mg total) by mouth 2 (two) times daily. 02/08/13   Heather Laisure, PA-C  phenazopyridine (PYRIDIUM) 200 MG tablet Take 1 tablet (200 mg total) by mouth 3 (three) times daily. 02/08/13   Heather Laisure, PA-C  QUEtiapine (SEROQUEL) 100 MG tablet Take 1 tablet (100 mg total) by mouth at bedtime. 10/29/13   Bernadene Bell, MD   BP 131/80  Pulse 75  Temp(Src) 97.4 F (36.3 C) (Oral)  Resp 16  SpO2 97%  Physical Exam  Nursing note and vitals reviewed. Constitutional: She appears well-developed and well-nourished. No distress.  HENT:  Head: Normocephalic and atraumatic.  Eyes: EOM are normal. Pupils are equal, round, and reactive to light.  Neck: Normal range of motion. Neck supple. No tracheal deviation present.  Cardiovascular: Normal rate.  Exam reveals no decreased pulses.   Pulses:      Radial pulses are 2+ on the right side.  Pulmonary/Chest: Effort normal. No respiratory distress.  Musculoskeletal: She exhibits tenderness. She exhibits no edema.       Right elbow: Normal.  Right wrist: Normal.       Right forearm: Normal.       Right hand: She exhibits decreased range of motion, tenderness, bony tenderness, laceration and swelling. She exhibits normal capillary refill and no deformity. Normal sensation noted. Normal strength noted.  Tenderness without deformity of index, middle, and ring fingers. Full ROM in index and ring fingers. Patient has slightly limited flexion in middle finger, likely due to pain. She does have good strength with flexion and extension against my finger.   Neurological: She is alert. No sensory deficit.  Motor, sensation, and vascular distal to the injury is fully intact.   Skin: Skin is warm and dry.  There is a small, open, circular skin avulsion of the right  middle finger. Wound is superficial extending only into dermis.  Psychiatric: She has a normal mood and affect. Her behavior is normal.    ED Course  Procedures (including critical care time) DIAGNOSTIC STUDIES: Oxygen Saturation is 97% on room air, normal by my interpretation.    COORDINATION OF CARE: 9:06 AM-Discussed treatment plan which includes Wound cleaning, X-rays of the right middle finger, bacitracin ointment, Ibuprofen, and Work Note with pt at bedside and pt agreed to plan.   Labs Review Labs Reviewed - No data to display  Imaging Review Dg Hand Complete Right  03/29/2014   CLINICAL DATA:  Trauma, acute laceration right middle finger  EXAM: RIGHT HAND - COMPLETE 3+ VIEW  COMPARISON:  None.  FINDINGS: Normal alignment without acute fracture or osseous abnormality. Preserved joint spaces. No significant arthropathy. No radiopaque foreign body.  IMPRESSION: No acute finding.   Electronically Signed   By: Daryll Brod M.D.   On: 03/29/2014 10:00     EKG Interpretation None      Patient seen and examined. Work-up initiated. Medications ordered.   Vital signs reviewed and are as follows: BP 131/80  Pulse 75  Temp(Src) 97.4 F (36.3 C) (Oral)  Resp 16  SpO2 97%  LMP 03/24/2014  10:19 AM Pt informed of x-ray results. Bacitracin bandaged placed over wound. Pt refuses finger splint. Counseled on wound care and signs and symptoms to return. Encouraged PCP follow-up if she continues to have pain in digits in 1 week.  MDM   Final diagnoses:  Trauma  Crushing injury of right hand, initial encounter  Laceration of right middle finger w/o foreign body w/o damage to nail, initial encounter   Patient with crush injury of fingers. X-ray negative. She has small skin avulsion not amenable to repair. Patient has good strength in both flexion and extension of fingers. Do not suspect tendon injury. Tetanus is up-to-date.  I personally performed the services described in this  documentation, which was scribed in my presence. The recorded information has been reviewed and is accurate.    Carlisle Cater, PA-C 03/29/14 1021

## 2014-03-29 NOTE — ED Notes (Signed)
Declined W/C at D/C and was escorted to lobby by RN. 

## 2014-03-29 NOTE — ED Provider Notes (Signed)
Medical screening examination/treatment/procedure(s) were performed by non-physician practitioner and as supervising physician I was immediately available for consultation/collaboration.   EKG Interpretation None     \   Blanchie Dessert, MD 03/29/14 1624

## 2014-03-29 NOTE — Discharge Instructions (Signed)
Please read and follow all provided instructions.  Your diagnoses today include:  1. Crushing injury of right hand, initial encounter   2. Trauma   3. Laceration of right middle finger w/o foreign body w/o damage to nail, initial encounter     Tests performed today include:  An x-ray of the affected area - does NOT show any broken bones  Vital signs. See below for your results today.   Medications prescribed:   Ibuprofen (Motrin, Advil) - anti-inflammatory pain medication  Do not exceed 600mg  ibuprofen every 6 hours, take with food  You have been prescribed an anti-inflammatory medication or NSAID. Take with food. Take smallest effective dose for the shortest duration needed for your pain. Stop taking if you experience stomach pain or vomiting.   Take any prescribed medications only as directed.  Home care instructions:   Follow any educational materials contained in this packet  Follow R.I.C.E. Protocol:  R - rest your injury   I  - use ice on injury without applying directly to skin  C - compress injury with bandage or splint  E - elevate the injury as much as possible  Follow-up instructions: Please follow-up with your primary care provider if you continue to have significant pain or trouble walking in 1 week. In this case you may have a severe injury that requires further care.   Return instructions:   Please return if your toes are numb or tingling, appear gray or blue, or you have severe pain (also elevate leg and loosen splint or wrap if you were given one)  Please return to the Emergency Department if you experience worsening symptoms.   Please return if you have any other emergent concerns.  Additional Information:  Your vital signs today were: BP 131/80   Pulse 75   Temp(Src) 97.4 F (36.3 C) (Oral)   Resp 16   SpO2 97%   LMP 03/24/2014 If your blood pressure (BP) was elevated above 135/85 this visit, please have this repeated by your doctor within one  month. -------------- If prescribed crutches for your injury: use crutches with non-weight bearing for the first few days. Then, you may walk as the pain allows, or as instructed. Start gradually with weight bearing on the affected side. Once you can walk pain free, then try jogging. When you can run forwards, then you can try moving side-to-side. If you cannot walk without crutches in one week, you need a re-check. --------------

## 2014-03-29 NOTE — ED Notes (Signed)
Pt caught right hand in laundry chute and has small laceration to right middle finger and pain in hand; bleeding controlled

## 2014-04-06 ENCOUNTER — Encounter (HOSPITAL_COMMUNITY): Payer: Self-pay | Admitting: Emergency Medicine

## 2014-08-27 ENCOUNTER — Encounter (HOSPITAL_COMMUNITY): Payer: Self-pay | Admitting: Emergency Medicine

## 2014-08-27 ENCOUNTER — Emergency Department (HOSPITAL_COMMUNITY)
Admission: EM | Admit: 2014-08-27 | Discharge: 2014-08-27 | Disposition: A | Discharge status: Home / Self Care | Payer: 59 | Source: Home / Self Care | Attending: Family Medicine | Admitting: Family Medicine

## 2014-08-27 DIAGNOSIS — K645 Perianal venous thrombosis: Secondary | ICD-10-CM

## 2014-08-27 MED ORDER — HYDROCORTISONE 2.5 % RE CREA: 1.0000 "application " | TOPICAL_CREAM | Freq: Two times a day (BID) | RECTAL | Status: DC

## 2014-08-27 NOTE — Discharge Instructions (Signed)
Warm tub soaks and use cream twice a day, see your doctor if further problems.

## 2014-08-27 NOTE — ED Provider Notes (Signed)
CSN: [id number]     Arrival date & time 08/27/14  1825 History   First MD Initiated Contact with Patient 08/27/14 1915     Chief Complaint  Patient presents with  . Mass   (Consider location/radiation/quality/duration/timing/severity/associated sxs/prior Treatment) Patient is a 29 y.o. female presenting with hematochezia. The history is provided by the patient.  Rectal Bleeding Duration:  4 days Progression:  Worsening Chronicity:  New Context: hemorrhoids, rectal pain and spontaneously   Context: not constipation and not diarrhea   Context comment:  Awoke mon am went to br and felt swelling at anus, painful. Pain details:    Quality:  Dull   Severity:  Moderate Similar prior episodes: no   Relieved by:  None tried Worsened by:  Nothing tried Associated symptoms: no abdominal pain and no vomiting     Past Medical History  Diagnosis Date  . Herpes   . Urinary tract infection    Past Surgical History  Procedure Laterality Date  . Wrist surgery    . Fracture surgery      left wrist   Family History  Problem Relation Age of Onset  . Adopted: Yes  . Cancer Mother   . Cancer Father    History  Substance Use Topics  . Smoking status: Never Smoker   . Smokeless tobacco: Not on file  . Alcohol Use: 0.6 oz/week    1 Cans of beer per week   OB History    Gravida Para Term Preterm AB TAB SAB Ectopic Multiple Living   1              Review of Systems  Constitutional: Negative.   Gastrointestinal: Positive for hematochezia and rectal pain. Negative for nausea, vomiting, abdominal pain, diarrhea and constipation.  Genitourinary: Negative.     Allergies  Review of patient's allergies indicates no known allergies.  Home Medications   Prior to Admission medications   Medication Sig Start Date End Date Taking? Authorizing Provider  Cranberry 450 MG CAPS Take 450 mg by mouth daily.    Historical Provider, MD  hydrocortisone (ANUSOL-HC) 2.5 % rectal cream Place 1  application rectally 2 (two) times daily. 08/27/14   Billy Fischer, MD  ibuprofen (ADVIL,MOTRIN) 600 MG tablet Take 1 tablet (600 mg total) by mouth every 6 (six) hours as needed. 03/29/14   Carlisle Cater, PA-C  nitrofurantoin, macrocrystal-monohydrate, (MACROBID) 100 MG capsule Take 1 capsule (100 mg total) by mouth 2 (two) times daily. 02/08/13   Heather Laisure, PA-C  phenazopyridine (PYRIDIUM) 200 MG tablet Take 1 tablet (200 mg total) by mouth 3 (three) times daily. 02/08/13   Heather Laisure, PA-C  QUEtiapine (SEROQUEL) 100 MG tablet Take 1 tablet (100 mg total) by mouth at bedtime. 10/29/13   Bernadene Bell, MD   BP 147/93 mmHg  Pulse 98  Temp(Src) 98.3 F (36.8 C) (Oral)  Resp 16  SpO2 100%  LMP 08/27/2014 Physical Exam  Constitutional: She appears well-developed and well-nourished.  Abdominal: Bowel sounds are normal.  Genitourinary: Rectal exam shows external hemorrhoid and tenderness.     Nursing note and vitals reviewed.   ED Course  Procedures (including critical care time) Labs Review Labs Reviewed - No data to display  Imaging Review No results found.   MDM   1. External hemorrhoid, thrombosed        Billy Fischer, MD 08/27/14 [phone]

## 2014-08-27 NOTE — ED Notes (Signed)
C/o a growth/mass around anus/rectum Denies it's protruding from anus; Painful Denies fevers, chills Alert, no signs of acute distress

## 2014-12-23 ENCOUNTER — Other Ambulatory Visit: Payer: Self-pay

## 2015-01-27 ENCOUNTER — Other Ambulatory Visit: Payer: Self-pay | Admitting: Obstetrics and Gynecology

## 2015-01-27 ENCOUNTER — Other Ambulatory Visit (HOSPITAL_COMMUNITY)
Admission: RE | Admit: 2015-01-27 | Discharge: 2015-01-27 | Disposition: A | Discharge status: Home / Self Care | Payer: 59 | Source: Ambulatory Visit | Attending: Obstetrics and Gynecology | Admitting: Obstetrics and Gynecology

## 2015-01-27 DIAGNOSIS — Z1151 Encounter for screening for human papillomavirus (HPV): Secondary | ICD-10-CM | POA: Diagnosis present

## 2015-01-27 DIAGNOSIS — Z01419 Encounter for gynecological examination (general) (routine) without abnormal findings: Secondary | ICD-10-CM | POA: Diagnosis present

## 2015-01-29 LAB — CYTOLOGY - PAP

## 2015-02-18 ENCOUNTER — Encounter: Payer: Self-pay | Admitting: Podiatry

## 2015-02-18 ENCOUNTER — Telehealth: Payer: Self-pay | Admitting: *Deleted

## 2015-02-18 ENCOUNTER — Ambulatory Visit (INDEPENDENT_AMBULATORY_CARE_PROVIDER_SITE_OTHER): Payer: 59 | Admitting: Podiatry

## 2015-02-18 VITALS — BP 121/77 | HR 74 | Resp 16 | Ht 66.0 in | Wt 255.0 lb

## 2015-02-18 DIAGNOSIS — B353 Tinea pedis: Secondary | ICD-10-CM | POA: Diagnosis not present

## 2015-02-18 DIAGNOSIS — L603 Nail dystrophy: Secondary | ICD-10-CM | POA: Diagnosis not present

## 2015-02-18 DIAGNOSIS — L600 Ingrowing nail: Secondary | ICD-10-CM

## 2015-02-18 MED ORDER — NEOMYCIN-POLYMYXIN-HC 3.5-10000-1 OT SOLN: OTIC | Status: DC

## 2015-02-18 NOTE — Progress Notes (Signed)
   Subjective:    Patient ID: Alexis Meyer, female    DOB: 1986-05-28, 29 y.o.   MRN: [id number]  HPI: She presents today with a long duration of pain to the medial border of her right hallux. She states that the nails are starting to get thick and discolored and become more painful as time goes on. She states that she cannot even cut the nails because they're so painful and thick. She also states that she has severe itching to the plantar aspect of the bilateral foot and has tried over-the-counter ointments and creams all to no avail. She states that it would alleviate symptoms were sure. I'll come back worse later.    Review of Systems  HENT: Positive for sinus pressure and sore throat.   Skin: Positive for color change and rash.       Change in nails  All other systems reviewed and are negative.      Objective:   Physical Exam: 29 year old female presents today with chief complaint painful toenails in no acute distress. Vital signs are stable she is alert and oriented 3 pulses are strongly palpable bilateral. Neurologic sensorium is intact per Semmes-Weinstein monofilament and deep tendon reflexes are intact bilateral and muscle strength is 5 over 5 dorsiflexion plantar flexors and inverters and everters all into the musculature is intact. Orthopedic evaluation of the straight awl joints distal to the ankle level range of motion without crepitation. Cutaneous evaluation does demonstrates dry xerotic skin at plantar aspect of the bilateral foot with small vesicular lesions consistent with a vesicular tinea pedis. She also has thickened dystrophic possibly mycotic nails. She has 1 sharp incurvated nail along the medial border of the hallux right. This does demonstrate erythema and tenderness on palpation.        Assessment & Plan:  Assessment: Ingrown nail paronychia abscess hallux right. Onychomycosis/nail dystrophy lesser nails as well. Tinea pedis bilateral.  Plan: Discussed  etiology pathology conservative versus surgical therapies. At this point we took samples of the nail and skin to be sent for pathologic evaluation. We also performed a chemical matrixectomy to the tibial border of the hallux right. She tolerated this procedure well after local anesthesia was administered. She was given both oral and written home-going instructions for the care and soaking of the toe. We will send the sample to the pathologist and once we receive the results we will notify her as to those results and get her back in here for an evaluation and discussion. Otherwise I will follow up with her in 1 week just to reevaluate the matrixectomy site. She will call with questions or concerns

## 2015-02-18 NOTE — Patient Instructions (Signed)

## 2015-02-18 NOTE — Telephone Encounter (Signed)
Toenail fragments mailed to [facility] for definitive diagnosis of fungal elements.

## 2015-02-25 ENCOUNTER — Encounter: Payer: Self-pay | Admitting: Podiatry

## 2015-02-25 ENCOUNTER — Encounter: Payer: 59 | Admitting: Podiatry

## 2015-02-25 NOTE — Patient Instructions (Signed)

## 2015-02-25 NOTE — Progress Notes (Signed)
This encounter was created in error - please disregard.

## 2015-03-01 ENCOUNTER — Telehealth: Payer: Self-pay | Admitting: *Deleted

## 2015-03-01 NOTE — Telephone Encounter (Signed)
Pt request toenail culture results.  I reviewed chart and the labs had not be received, left message informing.

## 2015-03-11 ENCOUNTER — Ambulatory Visit: Payer: 59 | Admitting: Podiatry

## 2015-03-11 ENCOUNTER — Telehealth: Payer: Self-pay | Admitting: *Deleted

## 2015-03-11 NOTE — Telephone Encounter (Addendum)
Dr. Milinda Pointer reviewed fungal culture result +, and request pt make an appt to discuss treatment.  Left message with orders.  Mobile number is not in service.

## 2015-03-25 ENCOUNTER — Encounter: Payer: Self-pay | Admitting: Podiatry

## 2015-03-25 ENCOUNTER — Ambulatory Visit (INDEPENDENT_AMBULATORY_CARE_PROVIDER_SITE_OTHER): Payer: 59 | Admitting: Podiatry

## 2015-03-25 DIAGNOSIS — Z79899 Other long term (current) drug therapy: Secondary | ICD-10-CM | POA: Diagnosis not present

## 2015-03-25 DIAGNOSIS — L600 Ingrowing nail: Secondary | ICD-10-CM

## 2015-03-25 MED ORDER — TERBINAFINE HCL 250 MG PO TABS: 250.0000 mg | ORAL_TABLET | Freq: Every day | ORAL | Status: DC

## 2015-03-25 NOTE — Progress Notes (Signed)
She presents today for follow-up of her matrixectomy hallux right. She denies fever chills nausea vomiting muscle aches and pains states that is doing very well. She also presents for follow-up of her lab report.  Objective: Vital signs are stable she is alert and oriented 3. Pulses are strongly palpable. Neurologic sensorium is intact. Her nail appears to be healing very nicely no signs of infection. Lab report did demonstrate fungus and nail dystrophy.  Assessment: Onychomycosis with nail dystrophy. Well healing surgical toe right.  Plan: I suggested that she continue to soak in Epsom salts and warm water. We started her on Lamisil 250 mg tablets 1 by mouth daily 30 and divided her with a requisition for liver profile. Should this come back abnormal I will notify her immediately otherwise I will follow up with her in 1 month.  Roselind Messier DPM

## 2015-05-04 ENCOUNTER — Ambulatory Visit: Payer: 59 | Admitting: Podiatry

## 2015-05-07 ENCOUNTER — Encounter (HOSPITAL_COMMUNITY): Payer: Self-pay | Admitting: Emergency Medicine

## 2015-05-07 ENCOUNTER — Emergency Department (HOSPITAL_COMMUNITY)
Admission: EM | Admit: 2015-05-07 | Discharge: 2015-05-07 | Disposition: A | Discharge status: Home / Self Care | Payer: 59 | Attending: Emergency Medicine | Admitting: Emergency Medicine

## 2015-05-07 DIAGNOSIS — R233 Spontaneous ecchymoses: Secondary | ICD-10-CM | POA: Insufficient documentation

## 2015-05-07 DIAGNOSIS — Z79899 Other long term (current) drug therapy: Secondary | ICD-10-CM | POA: Insufficient documentation

## 2015-05-07 DIAGNOSIS — R1110 Vomiting, unspecified: Secondary | ICD-10-CM | POA: Diagnosis not present

## 2015-05-07 DIAGNOSIS — Z87440 Personal history of urinary (tract) infections: Secondary | ICD-10-CM | POA: Diagnosis not present

## 2015-05-07 DIAGNOSIS — Z8619 Personal history of other infectious and parasitic diseases: Secondary | ICD-10-CM | POA: Diagnosis not present

## 2015-05-07 DIAGNOSIS — R220 Localized swelling, mass and lump, head: Secondary | ICD-10-CM

## 2015-05-07 LAB — CBC WITH DIFFERENTIAL/PLATELET
BASOS ABS: 0 10*3/uL (ref 0.0–0.1)
BASOS PCT: 0 %
Eosinophils Absolute: 0.2 10*3/uL (ref 0.0–0.7)
Eosinophils Relative: 2 %
HEMATOCRIT: 41.4 % (ref 36.0–46.0)
HEMOGLOBIN: 13.5 g/dL (ref 12.0–15.0)
Lymphocytes Relative: 21 %
Lymphs Abs: 2.1 10*3/uL (ref 0.7–4.0)
MCH: 29.4 pg (ref 26.0–34.0)
MCHC: 32.6 g/dL (ref 30.0–36.0)
MCV: 90.2 fL (ref 78.0–100.0)
MONOS PCT: 7 %
Monocytes Absolute: 0.7 10*3/uL (ref 0.1–1.0)
NEUTROS ABS: 6.8 10*3/uL (ref 1.7–7.7)
Neutrophils Relative %: 70 %
Platelets: 268 10*3/uL (ref 150–400)
RBC: 4.59 MIL/uL (ref 3.87–5.11)
RDW: 15.1 % (ref 11.5–15.5)
WBC: 9.7 10*3/uL (ref 4.0–10.5)

## 2015-05-07 LAB — BASIC METABOLIC PANEL
Anion gap: 8 (ref 5–15)
BUN: 7 mg/dL (ref 6–20)
CO2: 24 mmol/L (ref 22–32)
Calcium: 9 mg/dL (ref 8.9–10.3)
Chloride: 104 mmol/L (ref 101–111)
Creatinine, Ser: 0.65 mg/dL (ref 0.44–1.00)
GFR calc Af Amer: >60 mL/min (ref >60)
GFR calc non Af Amer: >60 mL/min (ref >60)
Glucose, Bld: 98 mg/dL (ref 65–99)
Potassium: 3.8 mmol/L (ref 3.5–5.1)
Sodium: 136 mmol/L (ref 135–145)

## 2015-05-07 MED ORDER — EPINEPHRINE 0.3 MG/0.3ML IJ SOAJ
0.3000 mg | Freq: Once | INTRAMUSCULAR | Status: AC
  Administered 2015-05-07: 0.3 mg via INTRAMUSCULAR

## 2015-05-07 MED ORDER — EPINEPHRINE 0.3 MG/0.3ML IJ SOAJ
INTRAMUSCULAR | Status: AC
  Administered 2015-05-07: 0.3 mg via INTRAMUSCULAR
  Filled 2015-05-07: qty 0.3

## 2015-05-07 MED ORDER — METHYLPREDNISOLONE SODIUM SUCC 125 MG IJ SOLR
125.0000 mg | Freq: Once | INTRAMUSCULAR | Status: AC
Start: 2015-05-07 — End: 2015-05-07
  Administered 2015-05-07: 125 mg via INTRAVENOUS
  Filled 2015-05-07: qty 2

## 2015-05-07 MED ORDER — SODIUM CHLORIDE 0.9 % IV BOLUS (SEPSIS)
1000.0000 mL | Freq: Once | INTRAVENOUS | Status: AC
  Administered 2015-05-07: 1000 mL via INTRAVENOUS

## 2015-05-07 MED ORDER — EPINEPHRINE HCL 1 MG/ML IJ SOLN
0.3000 mg | Freq: Once | INTRAMUSCULAR | Status: DC
Start: 2015-05-07 — End: 2015-05-07
  Filled 2015-05-07: qty 1

## 2015-05-07 NOTE — ED Provider Notes (Signed)
CSN: [id number]     Arrival date & time 05/07/15  [id number] History   First MD Initiated Contact with Patient 05/07/15 1018     Chief Complaint  Patient presents with  . Allergic Reaction     (Consider location/radiation/quality/duration/timing/severity/associated sxs/prior Treatment) HPI Comments: Patient is a 29 year old female with no significant past medical history. She presents for evaluation of facial swelling and throat swelling that started this morning. She states that yesterday evening her husband brought her a sandwich from [employer]. Shortly after eating it she became very ill and vomited forcefully multiple times. She went to bed, then woke up this morning with facial swelling, redness, and fullness in her throat. She denies any difficulty breathing. She denies any wheezing. She took Benadryl at home prior to coming here.  Patient is a 29 y.o. female presenting with allergic reaction. The history is provided by the patient.  Allergic Reaction Presenting symptoms: difficulty swallowing, rash and swelling   Severity:  Moderate Relieved by:  Nothing Worsened by:  Nothing tried Ineffective treatments:  None tried   Past Medical History  Diagnosis Date  . Herpes   . Urinary tract infection    Past Surgical History  Procedure Laterality Date  . Wrist surgery    . Fracture surgery      left wrist   Family History  Problem Relation Age of Onset  . Adopted: Yes  . Cancer Mother   . Cancer Father    Social History  Substance Use Topics  . Smoking status: Never Smoker   . Smokeless tobacco: Never Used  . Alcohol Use: 0.6 oz/week    1 Cans of beer per week   OB History    Gravida Para Term Preterm AB TAB SAB Ectopic Multiple Living   1              Review of Systems  HENT: Positive for trouble swallowing.   Skin: Positive for rash.  All other systems reviewed and are negative.     Allergies  Nickel  Home Medications   Prior to Admission medications    Medication Sig Start Date End Date Taking? Authorizing Provider  neomycin-polymyxin-hydrocortisone (CORTISPORIN) otic solution Apply one to two drops to toe after soaking twice daily. 02/18/15   Max T Hyatt, DPM  OMEPRAZOLE PO Take by mouth.    Historical Provider, MD  terbinafine (LAMISIL) 250 MG tablet Take 1 tablet (250 mg total) by mouth daily. 03/25/15   Max T Hyatt, DPM   BP 145/96 mmHg  Pulse 77  Temp(Src) 98.6 F (37 C) (Oral)  SpO2 98%  LMP 05/07/2015 (LMP Unknown) Physical Exam  Constitutional: She is oriented to person, place, and time. She appears well-developed and well-nourished. No distress.  HENT:  Head: Normocephalic and atraumatic.  Mouth/Throat: Oropharynx is clear and moist.  Neck: Normal range of motion. Neck supple.  Cardiovascular: Normal rate and regular rhythm.  Exam reveals no gallop and no friction rub.   No murmur heard. Pulmonary/Chest: Effort normal and breath sounds normal. No stridor. No respiratory distress. She has no wheezes. She has no rales.  Abdominal: Soft. Bowel sounds are normal. She exhibits no distension. There is no tenderness.  Musculoskeletal: Normal range of motion.  Lymphadenopathy:    She has no cervical adenopathy.  Neurological: She is alert and oriented to person, place, and time.  Skin: Skin is warm and dry. She is not diaphoretic.  There is a generalized swelling and rash to the face that almost appears  petechial in nature.  Nursing note and vitals reviewed.   ED Course  Procedures (including critical care time) Labs Review Labs Reviewed  BASIC METABOLIC PANEL  CBC WITH DIFFERENTIAL/PLATELET    Imaging Review No results found. I have personally reviewed and evaluated these images and lab results as part of my medical decision-making.   EKG Interpretation None      MDM   Final diagnoses:  None    Patient presents here with swelling of her face and throat after forcefully vomiting yesterday evening. She is  concerned she is having allergic reaction, however it appears to me that this is most likely small petechiae related to forceful vomiting. Her blood counts reveal a normal platelet count and electrolytes are normal. She feels much better after the medications given in the ER. She will be discharged to home with when necessary return. She has been observed for 2 hours with no further worsening of her condition.    Veryl Speak, MD 05/07/15 1245

## 2015-05-07 NOTE — ED Notes (Signed)
Notified Dr Stark Jock of pt and condition.

## 2015-05-07 NOTE — Discharge Instructions (Signed)
Take Benadryl 25 mg every 6 hours as needed if swelling or rash worsen.  Return to the ER for difficulty breathing or swallowing.

## 2015-05-07 NOTE — ED Notes (Signed)
Pt with c/o allergic reaction to unknown with hives over face and spreading to arms noted today, emesis x 5 last night after eating [employer], with throat swelling starting last night worse this am.  Pt ambulatory to room, swelling to back of throat noted by this RN, still patent.  Pt in NAD, A&O.

## 2015-05-18 NOTE — H&P (Signed)
Alexis Meyer is a 29 y.o. female , originally referred to me by Dr. Christophe Louis, for robotic assisted myomectomy.  She was diagnosed with fibroids because of abnormal uterine bleeding and was placed on oral contraceptives.  She developed breakthrough bleeding and had to stop the pills.  She has been having monthly periods but with heavy flow and prolonged duration.  Patient would like to preserve her childbearing potential.  Pertinent Gynecological History: Menses: flow is excessive with use of 3 pads or tampons on heaviest days Bleeding: dysfunctional uterine bleeding Contraception: none DES exposure: denies Blood transfusions: none Sexually transmitted diseases: no past history Previous GYN Procedures: No  Last mammogram: normal Last pap: normal  OB History: G2 SAB 2   Menstrual History: Menarche age: 66 No LMP recorded.    Past Medical History  Diagnosis Date  . Herpes   . Urinary tract infection                     Past Surgical History  Procedure Laterality Date  . Wrist surgery    . Fracture surgery      left wrist             Family History  Problem Relation Age of Onset  . Adopted: Yes  . Cancer Mother   . Cancer Father    No hereditary disease.  No cancer of breast, ovary, uterus. No cutaneous leiomyomatosis or renal cell carcinoma.  Social History   Social History  . Marital Status: Single    Spouse Name: N/A  . Number of Children: N/A  . Years of Education: N/A   Occupational History  . Not on file.   Social History Main Topics  . Smoking status: Never Smoker   . Smokeless tobacco: Never Used  . Alcohol Use: 0.6 oz/week    1 Cans of beer per week  . Drug Use: No  . Sexual Activity: Yes    Birth Control/ Protection: Condom   Other Topics Concern  . Not on file   Social History Narrative    Allergies  Allergen Reactions  . Nickel Hives    No current facility-administered medications on file prior to encounter.   Current  Outpatient Prescriptions on File Prior to Encounter  Medication Sig Dispense Refill  . neomycin-polymyxin-hydrocortisone (CORTISPORIN) otic solution Apply one to two drops to toe after soaking twice daily. (Patient not taking: Reported on 05/07/2015) 10 mL 0  . OMEPRAZOLE PO Take by mouth.    . terbinafine (LAMISIL) 250 MG tablet Take 1 tablet (250 mg total) by mouth daily. (Patient not taking: Reported on 05/07/2015) 30 tablet 0     Review of Systems  Constitutional: Negative.   HENT: Negative.   Eyes: Negative.   Respiratory: Negative.   Cardiovascular: Negative.   Gastrointestinal: Negative.   Genitourinary: Negative.   Musculoskeletal: Negative.   Skin: Negative.   Neurological: Negative.   Endo/Heme/Allergies: Negative.   Psychiatric/Behavioral: Negative.      Physical Exam  There were no vitals taken for this visit. Constitutional: She is oriented to person, place, and time. She appears well-developed and well-nourished.  HENT:  Head: Normocephalic and atraumatic.  Nose: Nose normal.  Mouth/Throat: Oropharynx is clear and moist. No oropharyngeal exudate.  Eyes: Conjunctivae normal and EOM are normal. Pupils are equal, round, and reactive to light. No scleral icterus.  Neck: Normal range of motion. Neck supple. No tracheal deviation present. No thyromegaly present.  Cardiovascular: Normal rate.   Respiratory:  Effort normal and breath sounds normal.  GI: Soft. Bowel sounds are normal. She exhibits no distension and no mass. There is no tenderness.  Lymphadenopathy:    She has no cervical adenopathy.  Neurological: She is alert and oriented to person, place, and time. She has normal reflexes.  Skin: Skin is warm.  Psychiatric: She has a normal mood and affect. Her behavior is normal. Judgment and thought content normal.       Assessment/Plan:  Intramural and subserosal uterine myomas, causing menorrhagia and pressure sensation. Preoperative for robot assisted  myomectomy Benefits and risks of robotic myomectomy were discussed with the patient and her family member again.  Bowel prep instructions were given.  All of patient's questions were answered.  She verbalized understanding.  She knows that she will need a cesarean delivery for future pregnancies, and that it is recommended she does not conceive for 2-3 months for uterus to heal.

## 2015-05-19 ENCOUNTER — Encounter (HOSPITAL_COMMUNITY)
Admission: RE | Admit: 2015-05-19 | Discharge: 2015-05-19 | Disposition: A | Discharge status: Home / Self Care | Payer: 59 | Source: Ambulatory Visit | Attending: Obstetrics and Gynecology | Admitting: Obstetrics and Gynecology

## 2015-05-19 ENCOUNTER — Encounter (HOSPITAL_COMMUNITY): Payer: Self-pay

## 2015-05-19 DIAGNOSIS — D259 Leiomyoma of uterus, unspecified: Secondary | ICD-10-CM | POA: Insufficient documentation

## 2015-05-19 DIAGNOSIS — Z01818 Encounter for other preprocedural examination: Secondary | ICD-10-CM | POA: Diagnosis not present

## 2015-05-19 HISTORY — DX: Bipolar disorder, unspecified: F31.9

## 2015-05-19 HISTORY — DX: Major depressive disorder, single episode, unspecified: F32.9

## 2015-05-19 HISTORY — DX: Gastro-esophageal reflux disease without esophagitis: K21.9

## 2015-05-19 HISTORY — DX: Anxiety disorder, unspecified: F41.9

## 2015-05-19 HISTORY — DX: Depression, unspecified: F32.A

## 2015-05-19 LAB — CBC
HEMATOCRIT: 39.6 % (ref 36.0–46.0)
Hemoglobin: 12.8 g/dL (ref 12.0–15.0)
MCH: 29.1 pg (ref 26.0–34.0)
MCHC: 32.3 g/dL (ref 30.0–36.0)
MCV: 90 fL (ref 78.0–100.0)
PLATELETS: 296 10*3/uL (ref 150–400)
RBC: 4.4 MIL/uL (ref 3.87–5.11)
RDW: 15.1 % (ref 11.5–15.5)
WBC: 9.4 10*3/uL (ref 4.0–10.5)

## 2015-05-19 LAB — NO BLOOD PRODUCTS

## 2015-05-19 LAB — TYPE AND SCREEN
ABO/RH(D): O POS
ANTIBODY SCREEN: NEGATIVE

## 2015-05-19 LAB — ABO/RH: ABO/RH(D): O POS

## 2015-05-19 NOTE — Patient Instructions (Signed)
Your procedure is scheduled on:05/26/15  Enter through the Main Entrance at :72 am Pick up desk phone and dial [phone] and inform us of your arrival.  Please call [phone] if you have any problems the morning of surgery.  Remember: Do not eat food or drink liquids, including water, after midnight:Tuesday 05/25/15 Clear liquids are ok until: 8am on WED   You may brush your teeth the morning of surgery.  Take these meds the morning of surgery with a sip of water:Omeprazole  DO NOT wear jewelry, eye make-up, lipstick,body lotion, or dark fingernail polish.  (Polished toes are ok) You may wear deodorant.  If you are to be admitted after surgery, leave suitcase in car until your room has been assigned. Patients discharged on the day of surgery will not be allowed to drive home. Wear loose fitting, comfortable clothes for your ride home.

## 2015-05-25 NOTE — H&P (Signed)
Alexis Meyer is a 29 y.o. female , originally referred to me by Dr. Landry Mellow, for robotic assisted myomectomy. Her uterus is enlarged with an anterior transmural 11.4 x 8.1 x 9.9 cm leiomyoma with internal anechoic areas, probably representative of degeneration. Endometrium is not distorted by the leiomyoma.  Pertinent Gynecological History: Menses: Normal Bleeding: Normal Contraception: none DES exposure: denies Blood transfusions: none Sexually transmitted diseases: no past history Previous GYN Procedures: None  Last mammogram: normal Last pap: normal  OB History: G2, SAB2   Menstrual History: Menarche age: 20 No LMP recorded.    Past Medical History  Diagnosis Date  . Herpes   . Urinary tract infection   . Depression   . Anxiety   . Bipolar disorder ([place])     has RX but doesn't take it  . GERD (gastroesophageal reflux disease)                     Past Surgical History  Procedure Laterality Date  . Wrist surgery    . Fracture surgery      left wrist             Family History  Problem Relation Age of Onset  . Adopted: Yes  . Cancer Mother   . Cancer Father    No hereditary disease.  No cancer of breast, ovary, uterus. No cutaneous leiomyomatosis or renal cell carcinoma.  Social History   Social History  . Marital Status: Single    Spouse Name: N/A  . Number of Children: N/A  . Years of Education: N/A   Occupational History  . Not on file.   Social History Main Topics  . Smoking status: Never Smoker   . Smokeless tobacco: Never Used  . Alcohol Use: 0.6 oz/week    1 Cans of beer per week     Comment: socially  . Drug Use: No  . Sexual Activity: Yes    Birth Control/ Protection: Condom   Other Topics Concern  . Not on file   Social History Narrative    Allergies  Allergen Reactions  . Nickel Hives    No current facility-administered medications on file prior to encounter.   Current Outpatient Prescriptions on File Prior to Encounter   Medication Sig Dispense Refill  . neomycin-polymyxin-hydrocortisone (CORTISPORIN) otic solution Apply one to two drops to toe after soaking twice daily. (Patient not taking: Reported on 05/07/2015) 10 mL 0  . OMEPRAZOLE PO Take by mouth.    . terbinafine (LAMISIL) 250 MG tablet Take 1 tablet (250 mg total) by mouth daily. (Patient not taking: Reported on 05/07/2015) 30 tablet 0     Review of Systems  Constitutional: Negative.   HENT: Negative.   Eyes: Negative.   Respiratory: Negative.   Cardiovascular: Negative.   Gastrointestinal: Negative.   Genitourinary: Negative.   Musculoskeletal: Negative.   Skin: Negative.   Neurological: Negative.   Endo/Heme/Allergies: Negative.   Psychiatric/Behavioral: Negative.      Physical Exam  There were no vitals taken for this visit. Constitutional: She is oriented to person, place, and time. She appears well-developed and well-nourished.  HENT:  Head: Normocephalic and atraumatic.  Nose: Nose normal.  Mouth/Throat: Oropharynx is clear and moist. No oropharyngeal exudate.  Eyes: Conjunctivae normal and EOM are normal. Pupils are equal, round, and reactive to light. No scleral icterus.  Neck: Normal range of motion. Neck supple. No tracheal deviation present. No thyromegaly present.  Cardiovascular: Normal rate.   Respiratory: Effort  normal and breath sounds normal.  GI: Soft. Bowel sounds are normal. She exhibits no distension and no mass. There is no tenderness.  Lymphadenopathy:    She has no cervical adenopathy.  Neurological: She is alert and oriented to person, place, and time. She has normal reflexes.  Skin: Skin is warm.  Psychiatric: She has a normal mood and affect. Her behavior is normal. Judgment and thought content normal.       Assessment/Plan:  Plan: I recommended myomectomy because of the large size of the fibroid, instead of medical treatment. I reviewed surgical approaches to myomectomy: Laparotomy, laparoscopic with  GelPort assistance or with [employer] robot assistance.  Patient prefers a minimally invasive approach.  Therefore, I will try either da Vinci robot or laparoscopic GelPort assisted myomectomy. I had a long review of how minimally invasive surgery for myomectomies performed, including same-day surgery, postop recovery.  She understands that myomectomy requires cesarean delivery with future pregnancies. I recommended iron sulfate supplementation to reduce the likelihood of blood transfusion.  We will check her CBC preoperatively. I also made her aware that when she wants to get pregnant, she will probably need ovulation induction since her follicular phase appears prolonged. All of her questions were answered.  I spent roughly 60 minutes with this patient, 45 minutes of which was for counseling and coordinating her care.

## 2015-05-26 ENCOUNTER — Ambulatory Visit (HOSPITAL_COMMUNITY)
Admission: RE | Admit: 2015-05-26 | Discharge: 2015-05-26 | Disposition: A | Discharge status: Home / Self Care | Payer: 59 | Source: Ambulatory Visit | Attending: Obstetrics and Gynecology | Admitting: Obstetrics and Gynecology

## 2015-05-26 ENCOUNTER — Encounter (HOSPITAL_COMMUNITY): Payer: Self-pay | Admitting: *Deleted

## 2015-05-26 ENCOUNTER — Encounter (HOSPITAL_COMMUNITY)
Admission: RE | Disposition: A | Discharge status: Home / Self Care | Payer: Self-pay | Source: Ambulatory Visit | Attending: Obstetrics and Gynecology

## 2015-05-26 ENCOUNTER — Ambulatory Visit (HOSPITAL_COMMUNITY): Payer: 59 | Admitting: Anesthesiology

## 2015-05-26 DIAGNOSIS — D251 Intramural leiomyoma of uterus: Secondary | ICD-10-CM | POA: Diagnosis present

## 2015-05-26 DIAGNOSIS — K219 Gastro-esophageal reflux disease without esophagitis: Secondary | ICD-10-CM | POA: Diagnosis not present

## 2015-05-26 DIAGNOSIS — F418 Other specified anxiety disorders: Secondary | ICD-10-CM | POA: Insufficient documentation

## 2015-05-26 DIAGNOSIS — N920 Excessive and frequent menstruation with regular cycle: Secondary | ICD-10-CM | POA: Diagnosis present

## 2015-05-26 DIAGNOSIS — F319 Bipolar disorder, unspecified: Secondary | ICD-10-CM | POA: Diagnosis not present

## 2015-05-26 HISTORY — PX: ROBOT ASSISTED MYOMECTOMY: SHX5142

## 2015-05-26 SURGERY — ROBOTIC ASSISTED MYOMECTOMY
Anesthesia: General

## 2015-05-26 MED ORDER — MEPERIDINE HCL 25 MG/ML IJ SOLN: 6.2500 mg | INTRAMUSCULAR | Status: DC | PRN

## 2015-05-26 MED ORDER — SCOPOLAMINE 1 MG/3DAYS TD PT72
1.0000 | MEDICATED_PATCH | Freq: Once | TRANSDERMAL | Status: DC
  Administered 2015-05-26: 1.5 mg via TRANSDERMAL

## 2015-05-26 MED ORDER — FENTANYL CITRATE (PF) 250 MCG/5ML IJ SOLN
INTRAMUSCULAR | Status: AC
  Filled 2015-05-26: qty 5

## 2015-05-26 MED ORDER — FENTANYL CITRATE (PF) 100 MCG/2ML IJ SOLN
INTRAMUSCULAR | Status: AC
  Filled 2015-05-26: qty 2

## 2015-05-26 MED ORDER — HYDROMORPHONE HCL 1 MG/ML IJ SOLN
INTRAMUSCULAR | Status: DC | PRN
  Administered 2015-05-26 (×2): 0.5 mg via INTRAVENOUS

## 2015-05-26 MED ORDER — HYDROMORPHONE HCL 1 MG/ML IJ SOLN
INTRAMUSCULAR | Status: AC
  Filled 2015-05-26: qty 1

## 2015-05-26 MED ORDER — CEFAZOLIN SODIUM-DEXTROSE 2-3 GM-% IV SOLR
INTRAVENOUS | Status: AC
  Filled 2015-05-26: qty 50

## 2015-05-26 MED ORDER — HEPARIN SODIUM (PORCINE) 5000 UNIT/ML IJ SOLN
INTRAMUSCULAR | Status: AC
  Filled 2015-05-26: qty 3

## 2015-05-26 MED ORDER — CEFAZOLIN SODIUM-DEXTROSE 2-3 GM-% IV SOLR
2.0000 g | INTRAVENOUS | Status: AC
  Administered 2015-05-26: 2 g via INTRAVENOUS

## 2015-05-26 MED ORDER — SCOPOLAMINE 1 MG/3DAYS TD PT72
MEDICATED_PATCH | TRANSDERMAL | Status: AC
  Administered 2015-05-26: 1.5 mg via TRANSDERMAL
  Filled 2015-05-26: qty 1

## 2015-05-26 MED ORDER — SODIUM CHLORIDE 0.9 % IJ SOLN
INTRAMUSCULAR | Status: AC
  Filled 2015-05-26: qty 50

## 2015-05-26 MED ORDER — VASOPRESSIN 20 UNIT/ML IV SOLN
INTRAVENOUS | Status: AC
  Filled 2015-05-26: qty 1

## 2015-05-26 MED ORDER — DEXAMETHASONE SODIUM PHOSPHATE 4 MG/ML IJ SOLN
INTRAMUSCULAR | Status: AC
  Filled 2015-05-26: qty 1

## 2015-05-26 MED ORDER — ACETAMINOPHEN 10 MG/ML IV SOLN
1000.0000 mg | Freq: Once | INTRAVENOUS | Status: AC
  Administered 2015-05-26: 1000 mg via INTRAVENOUS
  Filled 2015-05-26: qty 100

## 2015-05-26 MED ORDER — ONDANSETRON HCL 4 MG PO TABS: 4.0000 mg | ORAL_TABLET | Freq: Three times a day (TID) | ORAL | Status: DC | PRN

## 2015-05-26 MED ORDER — ONDANSETRON HCL 4 MG/2ML IJ SOLN
INTRAMUSCULAR | Status: AC
  Filled 2015-05-26: qty 2

## 2015-05-26 MED ORDER — MIDAZOLAM HCL 2 MG/2ML IJ SOLN
INTRAMUSCULAR | Status: DC | PRN
  Administered 2015-05-26: 2 mg via INTRAVENOUS

## 2015-05-26 MED ORDER — VASOPRESSIN 20 UNIT/ML IV SOLN
INTRAVENOUS | Status: DC | PRN
  Administered 2015-05-26: 30 mL via INTRAMUSCULAR

## 2015-05-26 MED ORDER — LACTATED RINGERS IV SOLN
INTRAVENOUS | Status: DC
  Administered 2015-05-26 (×4): via INTRAVENOUS

## 2015-05-26 MED ORDER — PROMETHAZINE HCL 25 MG/ML IJ SOLN
6.2500 mg | INTRAMUSCULAR | Status: DC | PRN
Start: 2015-05-26 — End: 2015-05-26

## 2015-05-26 MED ORDER — HYDROMORPHONE HCL 1 MG/ML IJ SOLN
0.2500 mg | INTRAMUSCULAR | Status: DC | PRN
  Administered 2015-05-26 (×3): 0.5 mg via INTRAVENOUS

## 2015-05-26 MED ORDER — OXYCODONE-ACETAMINOPHEN 5-325 MG PO TABS
ORAL_TABLET | ORAL | Status: AC
  Administered 2015-05-26: 1
  Filled 2015-05-26: qty 1

## 2015-05-26 MED ORDER — ONDANSETRON HCL 4 MG/2ML IJ SOLN
INTRAMUSCULAR | Status: DC | PRN
  Administered 2015-05-26: 4 mg via INTRAVENOUS

## 2015-05-26 MED ORDER — METHYLENE BLUE 1 % INJ SOLN
INTRAMUSCULAR | Status: AC
  Filled 2015-05-26: qty 1

## 2015-05-26 MED ORDER — MIDAZOLAM HCL 2 MG/2ML IJ SOLN
INTRAMUSCULAR | Status: AC
  Filled 2015-05-26: qty 2

## 2015-05-26 MED ORDER — BUPIVACAINE-EPINEPHRINE (PF) 0.25% -1:200000 IJ SOLN
INTRAMUSCULAR | Status: AC
  Filled 2015-05-26: qty 30

## 2015-05-26 MED ORDER — PROPOFOL 10 MG/ML IV BOLUS
INTRAVENOUS | Status: DC | PRN
Start: 2015-05-26 — End: 2015-05-26
  Administered 2015-05-26: 200 mg via INTRAVENOUS

## 2015-05-26 MED ORDER — PROPOFOL 10 MG/ML IV BOLUS
INTRAVENOUS | Status: AC
  Filled 2015-05-26: qty 20

## 2015-05-26 MED ORDER — OXYCODONE-ACETAMINOPHEN 7.5-325 MG PO TABS: 1.0000 | ORAL_TABLET | ORAL | Status: DC | PRN

## 2015-05-26 MED ORDER — ROCURONIUM BROMIDE 100 MG/10ML IV SOLN
INTRAVENOUS | Status: AC
  Filled 2015-05-26: qty 1

## 2015-05-26 MED ORDER — METHYLENE BLUE 1 % INJ SOLN
INTRAMUSCULAR | Status: DC | PRN
  Administered 2015-05-26: 5 mL via SUBMUCOSAL

## 2015-05-26 MED ORDER — LIDOCAINE HCL (CARDIAC) 20 MG/ML IV SOLN
INTRAVENOUS | Status: AC
  Filled 2015-05-26: qty 5

## 2015-05-26 MED ORDER — ACETAMINOPHEN 10 MG/ML IV SOLN: 1000.0000 mg | Freq: Four times a day (QID) | INTRAVENOUS | Status: DC

## 2015-05-26 MED ORDER — ROCURONIUM BROMIDE 100 MG/10ML IV SOLN
INTRAVENOUS | Status: DC | PRN
  Administered 2015-05-26: 20 mg via INTRAVENOUS
  Administered 2015-05-26: 50 mg via INTRAVENOUS
  Administered 2015-05-26: 20 mg via INTRAVENOUS

## 2015-05-26 MED ORDER — GLYCOPYRROLATE 0.2 MG/ML IJ SOLN
INTRAMUSCULAR | Status: AC
  Filled 2015-05-26: qty 3

## 2015-05-26 MED ORDER — LIDOCAINE HCL (CARDIAC) 20 MG/ML IV SOLN
INTRAVENOUS | Status: DC | PRN
  Administered 2015-05-26: 100 mg via INTRAVENOUS

## 2015-05-26 MED ORDER — NEOSTIGMINE METHYLSULFATE 10 MG/10ML IV SOLN
INTRAVENOUS | Status: AC
  Filled 2015-05-26: qty 1

## 2015-05-26 MED ORDER — FENTANYL CITRATE (PF) 100 MCG/2ML IJ SOLN
INTRAMUSCULAR | Status: DC | PRN
  Administered 2015-05-26: 150 ug via INTRAVENOUS
  Administered 2015-05-26: 50 ug via INTRAVENOUS
  Administered 2015-05-26: 100 ug via INTRAVENOUS
  Administered 2015-05-26: 50 ug via INTRAVENOUS
  Administered 2015-05-26: 100 ug via INTRAVENOUS
  Administered 2015-05-26: 50 ug via INTRAVENOUS
  Administered 2015-05-26: 100 ug via INTRAVENOUS

## 2015-05-26 MED ORDER — BUPIVACAINE-EPINEPHRINE 0.25% -1:200000 IJ SOLN
INTRAMUSCULAR | Status: DC | PRN
  Administered 2015-05-26: 13 mL

## 2015-05-26 MED ORDER — DEXAMETHASONE SODIUM PHOSPHATE 10 MG/ML IJ SOLN
INTRAMUSCULAR | Status: DC | PRN
  Administered 2015-05-26: 4 mg via INTRAVENOUS

## 2015-05-26 MED ORDER — DEXTROSE 5 % IN LACTATED RINGERS IV BOLUS
500.0000 mL | Freq: Once | INTRAVENOUS | Status: AC
  Administered 2015-05-26: 500 mL via INTRAVENOUS

## 2015-05-26 SURGICAL SUPPLY — 68 items
ADAPTER CATH SYR TO TUBING 38M (ADAPTER) IMPLANT
BARRIER ADHS 3X4 INTERCEED (GAUZE/BANDAGES/DRESSINGS) IMPLANT
CATH ROBINSON RED A/P 16FR (CATHETERS) IMPLANT
CHLORAPREP W/TINT 26ML (MISCELLANEOUS) ×3 IMPLANT
CLOTH BEACON ORANGE TIMEOUT ST (SAFETY) ×3 IMPLANT
CONT PATH 16OZ SNAP LID 3702 (MISCELLANEOUS) ×3 IMPLANT
COVER BACK TABLE 60X90IN (DRAPES) ×6 IMPLANT
COVER TIP SHEARS 8 DVNC (MISCELLANEOUS) ×1 IMPLANT
COVER TIP SHEARS 8MM DA VINCI (MISCELLANEOUS) ×2
DECANTER SPIKE VIAL GLASS SM (MISCELLANEOUS) ×6 IMPLANT
DEFOGGER SCOPE WARMER CLEARIFY (MISCELLANEOUS) ×3 IMPLANT
DEVICE TROCAR PUNCTURE CLOSURE (ENDOMECHANICALS) IMPLANT
DRSG COVADERM PLUS 2X2 (GAUZE/BANDAGES/DRESSINGS) ×3 IMPLANT
DRSG OPSITE POSTOP 3X4 (GAUZE/BANDAGES/DRESSINGS) ×3 IMPLANT
ELECT REM PT RETURN 9FT ADLT (ELECTROSURGICAL) ×3
ELECTRODE REM PT RTRN 9FT ADLT (ELECTROSURGICAL) ×1 IMPLANT
FILTER STRAW FLUID ASPIR (MISCELLANEOUS) IMPLANT
GAUZE VASELINE 3X9 (GAUZE/BANDAGES/DRESSINGS) IMPLANT
GLOVE BIO SURGEON STRL SZ8 (GLOVE) ×9 IMPLANT
GLOVE BIOGEL PI IND STRL 7.0 (GLOVE) ×1 IMPLANT
GLOVE BIOGEL PI IND STRL 8.5 (GLOVE) ×1 IMPLANT
GLOVE BIOGEL PI INDICATOR 7.0 (GLOVE) ×2
GLOVE BIOGEL PI INDICATOR 8.5 (GLOVE) ×2
IRRIGATOR SUCTION SS 5MM (IRRIGATION / IRRIGATOR) ×6 IMPLANT
KIT ABG SYR 3ML LUER SLIP (SYRINGE) ×3 IMPLANT
KIT ACCESSORY DA VINCI DISP (KITS) ×2
KIT ACCESSORY DVNC DISP (KITS) ×1 IMPLANT
LEGGING LITHOTOMY PAIR STRL (DRAPES) ×3 IMPLANT
LIQUID BAND (GAUZE/BANDAGES/DRESSINGS) ×3 IMPLANT
MANIPULATOR UTERINE 4.5 ZUMI (MISCELLANEOUS) ×3 IMPLANT
NEEDLE INSUFFLATION 120MM (ENDOMECHANICALS) ×3 IMPLANT
NEEDLE SPNL 22GX7 QUINCKE BK (NEEDLE) IMPLANT
NS IRRIG 1000ML POUR BTL (IV SOLUTION) ×3 IMPLANT
PACK ROBOT WH (CUSTOM PROCEDURE TRAY) ×3 IMPLANT
PACK ROBOTIC GOWN (GOWN DISPOSABLE) ×3 IMPLANT
PAD POSITIONING PINK XL (MISCELLANEOUS) ×3 IMPLANT
RINGERS IRRIG 1000ML POUR BTL (IV SOLUTION) ×9 IMPLANT
SEPRAFILM MEMBRANE 5X6 (MISCELLANEOUS) ×6 IMPLANT
SET CYSTO W/LG BORE CLAMP LF (SET/KITS/TRAYS/PACK) IMPLANT
SET IRRIG TUBING LAPAROSCOPIC (IRRIGATION / IRRIGATOR) ×3 IMPLANT
SET TRI-LUMEN FLTR TB AIRSEAL (TUBING) IMPLANT
SLEEVE XCEL OPT CAN 5 100 (ENDOMECHANICALS) ×3 IMPLANT
SUT DVC VLOC 180 2-0 12IN GS21 (SUTURE) ×6
SUT MNCRL AB 4-0 PS2 18 (SUTURE) ×3 IMPLANT
SUT PDS AB 4-0 SH 27 (SUTURE) IMPLANT
SUT VIC AB 2-0 CT1 27 (SUTURE) ×4
SUT VIC AB 2-0 CT1 TAPERPNT 27 (SUTURE) ×2 IMPLANT
SUT VIC AB 3-0 SH 27 (SUTURE) ×2
SUT VIC AB 3-0 SH 27X BRD (SUTURE) ×1 IMPLANT
SUT VIC AB 4-0 SH 27 (SUTURE) ×8
SUT VIC AB 4-0 SH 27XANBCTRL (SUTURE) ×4 IMPLANT
SUT VICRYL 4-0 PS2 18IN ABS (SUTURE) ×6 IMPLANT
SUT VLOC 180 2-0 9IN GS21 (SUTURE) ×3 IMPLANT
SUT VLOC 180 3-0 9IN GS21 (SUTURE) IMPLANT
SUTURE DVC VL 180 2-0 12INGS21 (SUTURE) ×2 IMPLANT
SYR 50ML LL SCALE MARK (SYRINGE) ×3 IMPLANT
SYR TOOMEY 50ML (SYRINGE) ×3 IMPLANT
SYS LAPSCP GELPORT 120MM (MISCELLANEOUS) ×3
SYSTEM CONVERTIBLE TROCAR (TROCAR) IMPLANT
SYSTEM LAPSCP GELPORT 120MM (MISCELLANEOUS) ×1 IMPLANT
TOWEL OR 17X24 6PK STRL BLUE (TOWEL DISPOSABLE) ×9 IMPLANT
TRAY FOLEY BAG SILVER LF 16FR (SET/KITS/TRAYS/PACK) ×3 IMPLANT
TROCAR 12M 150ML BLUNT (TROCAR) IMPLANT
TROCAR DILATING TIP 12MM 150MM (ENDOMECHANICALS) ×3 IMPLANT
TROCAR DISP BLADELESS 8 DVNC (TROCAR) ×1 IMPLANT
TROCAR DISP BLADELESS 8MM (TROCAR) ×2
TROCAR PORT AIRSEAL 5X120 (TROCAR) IMPLANT
TROCAR XCEL NON-BLD 5MMX100MML (ENDOMECHANICALS) ×3 IMPLANT

## 2015-05-26 NOTE — Anesthesia Procedure Notes (Signed)
Procedure Name: Intubation Date/Time: 05/26/2015 12:57 PM Performed by: Audri Kozub, Sheron Nightingale Pre-anesthesia Checklist: Patient identified, Timeout performed, Emergency Drugs available, Suction available and Patient being monitored Patient Re-evaluated:Patient Re-evaluated prior to inductionOxygen Delivery Method: Circle system utilized Preoxygenation: Pre-oxygenation with 100% oxygen Intubation Type: IV induction Ventilation: Mask ventilation without difficulty Laryngoscope Size: Mac and 3 Grade View: Grade I Tube type: Oral Number of attempts: 1 Airway Equipment and Method: Patient positioned with wedge pillow Placement Confirmation: ETT inserted through vocal cords under direct vision,  breath sounds checked- equal and bilateral and positive ETCO2 Secured at: 21 cm Dental Injury: Teeth and Oropharynx as per pre-operative assessment

## 2015-05-26 NOTE — Anesthesia Postprocedure Evaluation (Signed)
Anesthesia Post Note  Patient: Alexis Meyer  Procedure(s) Performed: Procedure(s) (LRB): ROBOTIC ASSISTED MYOMECTOMY (N/A)  Patient location during evaluation: PACU Anesthesia Type: General Level of consciousness: awake and alert Pain management: pain level controlled Vital Signs Assessment: post-procedure vital signs reviewed and stable Respiratory status: spontaneous breathing, nonlabored ventilation, respiratory function stable and patient connected to nasal cannula oxygen Cardiovascular status: blood pressure returned to baseline and stable Postop Assessment: no signs of nausea or vomiting Anesthetic complications: no    Last Vitals:  Filed Vitals:   05/26/15 1715 05/26/15 1730  BP: 131/73 130/73  Pulse: 75 71  Temp:    Resp: 14 14    Last Pain:  Filed Vitals:   05/26/15 1735  PainSc: 6                  Hilary Pundt JENNETTE

## 2015-05-26 NOTE — Transfer of Care (Signed)
Immediate Anesthesia Transfer of Care Note  Patient: Alexis Meyer  Procedure(s) Performed: Procedure(s) with comments: ROBOTIC ASSISTED MYOMECTOMY (N/A) - Dr. MODY will assist   Patient Location: PACU  Anesthesia Type:General  Level of Consciousness: awake, alert  and oriented  Airway & Oxygen Therapy: Patient Spontanous Breathing and Patient connected to nasal cannula oxygen  Post-op Assessment: Report given to RN and Post -op Vital signs reviewed and stable  Post vital signs: Reviewed and stable  Last Vitals:  Filed Vitals:   05/26/15 1012  BP: 142/87  Pulse: 75  Temp: 36.8 C  Resp: 18    Complications: No apparent anesthesia complications

## 2015-05-26 NOTE — H&P (Signed)
History and Physical Interval Note:  05/26/2015 12:31 PM  Alexis Meyer  has presented today for surgery, with the diagnosis of UTERINE FIBROIDS   The various methods of treatment have been discussed with the patient and family. After consideration of risks, benefits and other options for treatment, the patient has consented to  Procedure(s) with comments: ROBOTIC ASSISTED MYOMECTOMY (N/A) - Dr. MODY will assist  as a surgical intervention .  The patient's history has been reviewed, patient examined, no change in status, stable for surgery.  I have reviewed the patient's chart and labs.  Questions were answered to the patient's satisfaction.     Governor Specking

## 2015-05-26 NOTE — Discharge Instructions (Signed)
DISCHARGE INSTRUCTIONS: Laparoscopy  The following instructions have been prepared to help you care for yourself upon your return home today.  Wound care:  Do not get the incision wet for the first 24 hours. The incision should be kept clean and dry.  The Band-Aids or dressings may be removed the day after surgery.  Should the incision become sore, red, and swollen after the first week, check with your doctor.  Personal hygiene:  Shower the day after your procedure.  Activity and limitations:  Do NOT drive or operate any equipment today.  Do NOT lift anything more than 15 pounds for 2-3 weeks after surgery.  Do NOT rest in bed all day.  Walking is encouraged. Walk each day, starting slowly with 5-minute walks 3 or 4 times a day. Slowly increase the length of your walks.  Walk up and down stairs slowly.  Do NOT do strenuous activities, such as golfing, playing tennis, bowling, running, biking, weight lifting, gardening, mowing, or vacuuming for 2-4 weeks. Ask your doctor when it is okay to start.  Diet: Eat a light meal as desired this evening. You may resume your usual diet tomorrow.  Return to work: This is dependent on the type of work you do. For the most part you can return to a desk job within a week of surgery. If you are more active at work, please discuss this with your doctor.  What to expect after your surgery: You may have a slight burning sensation when you urinate on the first day. You may have a very small amount of blood in the urine. Expect to have a small amount of vaginal discharge/light bleeding for 1-2 weeks. It is not unusual to have abdominal soreness and bruising for up to 2 weeks. You may be tired and need more rest for about 1 week. You may experience shoulder pain for 24-72 hours. Lying flat in bed may relieve it.  Call your doctor for any of the following:  Develop a fever of 100.4 or greater  Inability to urinate 6 hours after discharge from  hospital  Severe pain not relieved by pain medications  Persistent of heavy bleeding at incision site  Redness or swelling around incision site after a week  Increasing nausea or vomiting  Patient Signature________________________________________ Nurse Signature_________________________________________ Post Anesthesia Home Care Instructions  Activity: Get plenty of rest for the remainder of the day. A responsible adult should stay with you for 24 hours following the procedure.  For the next 24 hours, DO NOT: -Drive a car -Paediatric nurse -Drink alcoholic beverages -Take any medication unless instructed by your physician -Make any legal decisions or sign important papers.  Meals: Start with liquid foods such as gelatin or soup. Progress to regular foods as tolerated. Avoid greasy, spicy, heavy foods. If nausea and/or vomiting occur, drink only clear liquids until the nausea and/or vomiting subsides. Call your physician if vomiting continues.  Special Instructions/Symptoms: Your throat may feel dry or sore from the anesthesia or the breathing tube placed in your throat during surgery. If this causes discomfort, gargle with warm salt water. The discomfort should disappear within 24 hours.  If you had a scopolamine patch placed behind your ear for the management of post- operative nausea and/or vomiting:  1. The medication in the patch is effective for 72 hours, after which it should be removed.  Wrap patch in a tissue and discard in the trash. Wash hands thoroughly with soap and water. 2. You may remove the patch  earlier than 72 hours if you experience unpleasant side effects which may include dry mouth, dizziness or visual disturbances. 3. Avoid touching the patch. Wash your hands with soap and water after contact with the patch.   

## 2015-05-26 NOTE — Anesthesia Preprocedure Evaluation (Signed)
Anesthesia Evaluation  Patient identified by MRN, date of birth, ID band Patient awake    Reviewed: Allergy & Precautions, NPO status , Patient's Chart, lab work & pertinent test results  History of Anesthesia Complications Negative for: history of anesthetic complications  Airway Mallampati: III  TM Distance: >3 FB Neck ROM: Full    Dental no notable dental hx. (+) Dental Advisory Given   Pulmonary neg pulmonary ROS,    Pulmonary exam normal breath sounds clear to auscultation       Cardiovascular negative cardio ROS Normal cardiovascular exam Rhythm:Regular Rate:Normal     Neuro/Psych PSYCHIATRIC DISORDERS Anxiety Depression Bipolar Disorder negative neurological ROS     GI/Hepatic Neg liver ROS, GERD  ,  Endo/Other  Morbid obesity  Renal/GU negative Renal ROS  negative genitourinary   Musculoskeletal negative musculoskeletal ROS (+)   Abdominal   Peds negative pediatric ROS (+)  Hematology negative hematology ROS (+)   Anesthesia Other Findings   Reproductive/Obstetrics negative OB ROS                             Anesthesia Physical Anesthesia Plan  ASA: III  Anesthesia Plan: General   Post-op Pain Management:    Induction: Intravenous  Airway Management Planned: Oral ETT  Additional Equipment:   Intra-op Plan:   Post-operative Plan: Extubation in OR  Informed Consent: I have reviewed the patients History and Physical, chart, labs and discussed the procedure including the risks, benefits and alternatives for the proposed anesthesia with the patient or authorized representative who has indicated his/her understanding and acceptance.   Dental advisory given  Plan Discussed with: CRNA  Anesthesia Plan Comments:         Anesthesia Quick Evaluation

## 2015-05-26 NOTE — Op Note (Signed)
Preoperative diagnosis:  Large uterine myoma, menorrhagia   Postoperative diagnosis: Uterine myomas, menorrhagia   Procedure: Laparoscopy, robotic assisted myomectomy, chromotubation  Anesthesia: Gen. endotracheal   Surgeon: Governor Specking   Assistant: Manon Hilding, MD  Complications: None   Estimated blood loss: 150 mL  Specimens: Myoma specimens to Path   Findings: On exam under anesthesia, external genitalia, Bartholin's, Skene's, urethra and vagina were normal. Cervix was grossly normal. The uterus was firm and enlarged to 16 gestational weeks size. The uterus sounded to 17 cm. Adnexa could not be evaluated by palpation. However there was no posterior fornix nodularity or induration.  On laparoscopy, diaphragm and liver surfaces were normal. Gallbladder was not visualized. Appendix was normal. The uterus was enlarged with a 11 x 10 cm anterior degenerating transmural myoma. In addition there were 3-4 myomas attached to this large fibroid, each measuring 1-2 cm. The posterior aspect of the fibroid made the anterior portion of the endometrial cavity. Both fallopian tubes appeared normal proximally and distally, with 5 out of 5 fimbria. Chromotubation was performed after the endometrial cavity was entered during myomectomy, therefore the patency of the tubes could not be determined. There was a 2 x 1 cm paratubal cyst just below the proximal segment of the right tube, within the broad ligament leaves. Both ovaries appeared normal. Posterior cul-de-sac peritoneum and pelvic sidewalls appeared normal. There was no sign of endometriosis.  Description of the procedure: Patient was placed in lithotomy position and general endotracheal anesthesia was given. 2 g of cefazolin were given intravenously for prophylaxis. She was prepped and draped in sterile manner. A Foley catheter was inserted into the bladder appeared . A ZUMI uterine manipulator was inserted into the uterus for uterine manipulation  and chromotubation. The uterus sounded to 17 cm.   An operative field was created on the abdomen and the surgeon was regloved. After preemptive anesthesia with quarter percent bupivacaine and 1:200,000 epinephrine, and supra-aumbilical skin incision was made 4 cm above the umbilicus.. A Verress needle was inserted and pneumoperitoneum was created with carbon dioxide. A 12 mm trocar was placed at this incision. Robotic laparoscope with 3-D camera was inserted and, under direct visualization, 1 left lateral 75mm incision was made and corresponding robotic trochar was placed. On the right side a second robotic trocar was placed as well as an 8 mm assistant port. We anticipated the need for a GelPort because of the large size of the myoma. Therefore we made a 3 cm Pfannenstiel incision suprapubically after preemptive anesthesia. After dissection of anatomic layers, peritoneal cavity was entered. A GelPort was placed. The da Vinci SI robot was docked to the patient after placing her in Trendelenburg position. The surgeon continued the rest of the procedure from the surgical console.  A dilute vasopressin solution (0.33 units per milliliter) was injected transcutaneously into the myometrium of the uterus. Using monopolar scissors on 78 W cutting and 51 W coagulating setting, a transverse incision was made over the fundus of the uterus which was elevated and enlarged due to the degenerating large myoma. The large myoma was carefully dissected, along with the attached 1-2 cm myomas. During the dissection, posterior aspect of the myoma was contiguous with the endometrium therefore endometrial cavity was entered. We continued the dissection anteriorly and were able to separate the myoma without any further entry into the endometrial cavity. 5 sutures of 4-0 Vicryl were placed to reapproximate the fundal endometrial layer, after carefully defining the anatomic layers within the uterus. Hemostasis was insured.  The myoma  defect was closed 3 layers the first and second layers were a 2-0 V-lock continuous suture and the third layer was a 3-0 Vicryl continuous suture. Good hemostasis was obtained.  The large myoma was manually morcellated at the Jack and taken out. Pelvis was copiously irrigated with lactated Ringer solution and a slurry of Seprafilm (2 sheets in 50 mL of lactated Ringer solution) was instilled into the pelvis as an adhesion barrier.  The fascia of the Pfannenstiel incision was approximated with 2-0 Vicryl continuous suture. The skin was approximated with 4-0 Monocryl in subcuticular stitches. The skin incisions were approximated with 4-0 Monocryl in subcuticular stitches and Dermabond.   The patient tolerated the procedure well and was transferred to recovery room in satisfactory condition.  Special Note: Due to to significant myometrial incisions, the patient is recommended to have cesarean delivery with future pregnancies  [facility]

## 2015-05-27 ENCOUNTER — Encounter (HOSPITAL_COMMUNITY): Payer: Self-pay | Admitting: Obstetrics and Gynecology

## 2015-05-27 MED ORDER — NEOSTIGMINE METHYLSULFATE 10 MG/10ML IV SOLN
INTRAVENOUS | Status: DC | PRN
  Administered 2015-05-26: 4 mg via INTRAVENOUS

## 2015-05-27 MED ORDER — GLYCOPYRROLATE 0.2 MG/ML IJ SOLN
INTRAMUSCULAR | Status: DC | PRN
  Administered 2015-05-26: 0.6 mg via INTRAVENOUS

## 2015-05-27 NOTE — Addendum Note (Signed)
Addendum  created 05/27/15 1734 by Genevie Ann, CRNA   Modules edited: Anesthesia Medication Administration

## 2015-06-15 ENCOUNTER — Ambulatory Visit: Payer: 59 | Admitting: Podiatry

## 2016-02-15 ENCOUNTER — Other Ambulatory Visit: Payer: Self-pay | Admitting: Obstetrics and Gynecology

## 2016-02-15 ENCOUNTER — Other Ambulatory Visit (HOSPITAL_COMMUNITY)
Admission: RE | Admit: 2016-02-15 | Discharge: 2016-02-15 | Disposition: A | Discharge status: Home / Self Care | Payer: 59 | Source: Ambulatory Visit | Attending: Obstetrics and Gynecology | Admitting: Obstetrics and Gynecology

## 2016-02-15 DIAGNOSIS — Z113 Encounter for screening for infections with a predominantly sexual mode of transmission: Secondary | ICD-10-CM | POA: Insufficient documentation

## 2016-02-15 DIAGNOSIS — Z01419 Encounter for gynecological examination (general) (routine) without abnormal findings: Secondary | ICD-10-CM | POA: Diagnosis not present

## 2016-02-15 DIAGNOSIS — N644 Mastodynia: Secondary | ICD-10-CM | POA: Diagnosis not present

## 2016-02-16 LAB — CYTOLOGY - PAP

## 2016-03-09 ENCOUNTER — Encounter: Payer: Self-pay | Admitting: Podiatry

## 2016-03-09 ENCOUNTER — Ambulatory Visit (INDEPENDENT_AMBULATORY_CARE_PROVIDER_SITE_OTHER): Payer: 59 | Admitting: Podiatry

## 2016-03-09 DIAGNOSIS — Z79899 Other long term (current) drug therapy: Secondary | ICD-10-CM | POA: Diagnosis not present

## 2016-03-09 DIAGNOSIS — L603 Nail dystrophy: Secondary | ICD-10-CM

## 2016-03-09 LAB — HEPATIC FUNCTION PANEL
ALBUMIN: 3.8 g/dL (ref 3.6–5.1)
ALK PHOS: 50 U/L (ref 33–115)
ALT: 7 U/L (ref 6–29)
AST: 11 U/L (ref 10–30)
BILIRUBIN TOTAL: 0.4 mg/dL (ref 0.2–1.2)
Bilirubin, Direct: 0.1 mg/dL (ref <=0.2)
Indirect Bilirubin: 0.3 mg/dL (ref 0.2–1.2)
TOTAL PROTEIN: 6.7 g/dL (ref 6.1–8.1)

## 2016-03-09 MED ORDER — TERBINAFINE HCL 250 MG PO TABS: 250.0000 mg | ORAL_TABLET | Freq: Every day | ORAL | 0 refills | Status: DC

## 2016-03-09 NOTE — Progress Notes (Signed)
She presents today to complaint of itching and burning to her feet with athlete's foot is also concerned about tinea pedis and onychomycosis which was previously diagnosed. She was unable to start the medication last year because she was about undergo surgery for a fibroid removal and she just never got started on the medicine.  Objective: Vital signs are stable alert and oriented 3. Pulses remain palpable no change in physical exam.  Assessment: Tinea pedis and onychomycosis.  Plan: We'll start her on Lamisil 250 mg tablets #30 one by mouth daily and requested a liver profile. We'll follow up with her in 1 month.

## 2016-03-09 NOTE — Patient Instructions (Signed)

## 2016-03-14 ENCOUNTER — Telehealth: Payer: Self-pay | Admitting: *Deleted

## 2016-03-14 NOTE — Telephone Encounter (Addendum)
-----   Message from Garrel Ridgel, [place] sent at 03/13/2016  7:00 AM EDT ----- Blood work looks good and may continue medication. 03/14/2016- Left message informing pt of Dr. Stephenie Acres orders.

## 2016-03-27 MED FILL — ONDANSETRON HCL 4 MG TABLET: 4 | 7 days supply | Qty: 20 | Fill #0

## 2016-03-27 MED FILL — TERBINAFINE HCL 250 MG TAB: 250 | 30 days supply | Qty: 30 | Fill #0

## 2016-04-06 ENCOUNTER — Encounter (INDEPENDENT_AMBULATORY_CARE_PROVIDER_SITE_OTHER): Payer: 59 | Admitting: Podiatry

## 2016-04-06 NOTE — Progress Notes (Signed)
This encounter was created in error - please disregard.

## 2016-05-11 MED FILL — TARON-C DHA CAPSULE: 53.5-38-1 | 30 days supply | Qty: 30 | Fill #0

## 2016-05-23 DIAGNOSIS — O26899 Other specified pregnancy related conditions, unspecified trimester: Secondary | ICD-10-CM | POA: Diagnosis not present

## 2016-05-23 DIAGNOSIS — Z3480 Encounter for supervision of other normal pregnancy, unspecified trimester: Secondary | ICD-10-CM | POA: Diagnosis not present

## 2016-05-23 DIAGNOSIS — Z3A08 8 weeks gestation of pregnancy: Secondary | ICD-10-CM | POA: Diagnosis not present

## 2016-05-23 LAB — OB RESULTS CONSOLE ABO/RH: RH Type: POSITIVE

## 2016-05-23 LAB — OB RESULTS CONSOLE HIV ANTIBODY (ROUTINE TESTING): HIV: NONREACTIVE

## 2016-05-23 LAB — OB RESULTS CONSOLE RUBELLA ANTIBODY, IGM: RUBELLA: IMMUNE

## 2016-05-23 LAB — OB RESULTS CONSOLE RPR: RPR: NONREACTIVE

## 2016-05-23 LAB — OB RESULTS CONSOLE ANTIBODY SCREEN: Antibody Screen: NEGATIVE

## 2016-05-23 LAB — OB RESULTS CONSOLE HEPATITIS B SURFACE ANTIGEN: Hepatitis B Surface Ag: NEGATIVE

## 2016-05-23 LAB — OB RESULTS CONSOLE GC/CHLAMYDIA
CHLAMYDIA, DNA PROBE: NEGATIVE
GC PROBE AMP, GENITAL: NEGATIVE

## 2016-05-31 DIAGNOSIS — Z3009 Encounter for other general counseling and advice on contraception: Secondary | ICD-10-CM | POA: Diagnosis not present

## 2016-05-31 DIAGNOSIS — Z1388 Encounter for screening for disorder due to exposure to contaminants: Secondary | ICD-10-CM | POA: Diagnosis not present

## 2016-05-31 DIAGNOSIS — Z0389 Encounter for observation for other suspected diseases and conditions ruled out: Secondary | ICD-10-CM | POA: Diagnosis not present

## 2016-06-07 DIAGNOSIS — N898 Other specified noninflammatory disorders of vagina: Secondary | ICD-10-CM | POA: Diagnosis not present

## 2016-06-07 DIAGNOSIS — O3429 Maternal care due to uterine scar from other previous surgery: Secondary | ICD-10-CM | POA: Diagnosis not present

## 2016-06-07 DIAGNOSIS — Z3480 Encounter for supervision of other normal pregnancy, unspecified trimester: Secondary | ICD-10-CM | POA: Diagnosis not present

## 2016-06-08 MED FILL — metroNIDAZOLE 500 MG TABS: 500 | 7 days supply | Qty: 14 | Fill #0

## 2016-07-04 MED FILL — TARON-C DHA CAPSULE: 53.5-38-1 | 30 days supply | Qty: 30 | Fill #1

## 2016-07-20 DIAGNOSIS — Z3482 Encounter for supervision of other normal pregnancy, second trimester: Secondary | ICD-10-CM | POA: Diagnosis not present

## 2016-08-02 DIAGNOSIS — O280 Abnormal hematological finding on antenatal screening of mother: Secondary | ICD-10-CM | POA: Diagnosis not present

## 2016-08-02 DIAGNOSIS — O285 Abnormal chromosomal and genetic finding on antenatal screening of mother: Secondary | ICD-10-CM | POA: Diagnosis not present

## 2016-08-02 DIAGNOSIS — Z3482 Encounter for supervision of other normal pregnancy, second trimester: Secondary | ICD-10-CM | POA: Diagnosis not present

## 2016-08-02 DIAGNOSIS — Z36 Encounter for antenatal screening for chromosomal anomalies: Secondary | ICD-10-CM | POA: Diagnosis not present

## 2016-08-02 MED FILL — TARON-C DHA CAPSULE: 53.5-38-1 | 30 days supply | Qty: 30 | Fill #0

## 2016-09-04 MED FILL — TARON-C DHA CAPSULE: 53.5-38-1 | 30 days supply | Qty: 30 | Fill #1

## 2016-09-26 DIAGNOSIS — Z3482 Encounter for supervision of other normal pregnancy, second trimester: Secondary | ICD-10-CM | POA: Diagnosis not present

## 2016-09-26 DIAGNOSIS — Z3480 Encounter for supervision of other normal pregnancy, unspecified trimester: Secondary | ICD-10-CM | POA: Diagnosis not present

## 2016-09-26 DIAGNOSIS — O99212 Obesity complicating pregnancy, second trimester: Secondary | ICD-10-CM | POA: Diagnosis not present

## 2016-10-06 MED FILL — TARON-C DHA CAPSULE: 53.5-38-1 | 30 days supply | Qty: 30 | Fill #2

## 2016-10-17 DIAGNOSIS — Z3483 Encounter for supervision of other normal pregnancy, third trimester: Secondary | ICD-10-CM | POA: Diagnosis not present

## 2016-10-17 DIAGNOSIS — O99213 Obesity complicating pregnancy, third trimester: Secondary | ICD-10-CM | POA: Diagnosis not present

## 2016-11-03 MED FILL — TARON-C DHA CAPSULE: 53.5-38-1 | 30 days supply | Qty: 30 | Fill #3

## 2016-11-14 DIAGNOSIS — O3429 Maternal care due to uterine scar from other previous surgery: Secondary | ICD-10-CM | POA: Diagnosis not present

## 2016-11-14 DIAGNOSIS — Z3483 Encounter for supervision of other normal pregnancy, third trimester: Secondary | ICD-10-CM | POA: Diagnosis not present

## 2016-11-14 DIAGNOSIS — Z23 Encounter for immunization: Secondary | ICD-10-CM | POA: Diagnosis not present

## 2016-11-17 ENCOUNTER — Encounter (HOSPITAL_COMMUNITY): Payer: Self-pay | Admitting: *Deleted

## 2016-11-17 ENCOUNTER — Telehealth (HOSPITAL_COMMUNITY): Payer: Self-pay | Admitting: *Deleted

## 2016-11-17 NOTE — Telephone Encounter (Signed)
Preadmission screen  

## 2016-11-20 ENCOUNTER — Telehealth (HOSPITAL_COMMUNITY): Payer: Self-pay | Admitting: *Deleted

## 2016-11-20 NOTE — Telephone Encounter (Signed)
Preadmission screen  

## 2016-11-21 ENCOUNTER — Telehealth (HOSPITAL_COMMUNITY): Payer: Self-pay | Admitting: *Deleted

## 2016-11-21 NOTE — Telephone Encounter (Signed)
Preadmission screen  

## 2016-11-22 ENCOUNTER — Telehealth (HOSPITAL_COMMUNITY): Payer: Self-pay | Admitting: *Deleted

## 2016-11-22 NOTE — Telephone Encounter (Signed)
Preadmission screen  

## 2016-11-23 ENCOUNTER — Encounter (HOSPITAL_COMMUNITY): Payer: Self-pay

## 2016-11-28 DIAGNOSIS — Z3483 Encounter for supervision of other normal pregnancy, third trimester: Secondary | ICD-10-CM | POA: Diagnosis not present

## 2016-11-30 ENCOUNTER — Other Ambulatory Visit: Payer: Self-pay | Admitting: Obstetrics and Gynecology

## 2016-12-01 ENCOUNTER — Encounter (HOSPITAL_COMMUNITY)
Admission: RE | Admit: 2016-12-01 | Discharge: 2016-12-01 | Disposition: A | Discharge status: Home / Self Care | Payer: 59 | Source: Ambulatory Visit | Attending: Obstetrics and Gynecology | Admitting: Obstetrics and Gynecology

## 2016-12-01 DIAGNOSIS — O3429 Maternal care due to uterine scar from other previous surgery: Secondary | ICD-10-CM | POA: Diagnosis not present

## 2016-12-01 DIAGNOSIS — O99214 Obesity complicating childbirth: Secondary | ICD-10-CM | POA: Diagnosis not present

## 2016-12-01 DIAGNOSIS — Z6841 Body Mass Index (BMI) 40.0 and over, adult: Secondary | ICD-10-CM | POA: Diagnosis not present

## 2016-12-01 DIAGNOSIS — O99344 Other mental disorders complicating childbirth: Secondary | ICD-10-CM | POA: Diagnosis not present

## 2016-12-01 DIAGNOSIS — F419 Anxiety disorder, unspecified: Secondary | ICD-10-CM | POA: Diagnosis not present

## 2016-12-01 DIAGNOSIS — F319 Bipolar disorder, unspecified: Secondary | ICD-10-CM | POA: Diagnosis not present

## 2016-12-01 DIAGNOSIS — Z3A36 36 weeks gestation of pregnancy: Secondary | ICD-10-CM | POA: Diagnosis not present

## 2016-12-01 LAB — CBC
HCT: 35 % — ABNORMAL LOW (ref 36.0–46.0)
HEMOGLOBIN: 11.4 g/dL — AB (ref 12.0–15.0)
MCH: 30.2 pg (ref 26.0–34.0)
MCHC: 32.6 g/dL (ref 30.0–36.0)
MCV: 92.8 fL (ref 78.0–100.0)
PLATELETS: 265 10*3/uL (ref 150–400)
RBC: 3.77 MIL/uL — ABNORMAL LOW (ref 3.87–5.11)
RDW: 15.9 % — AB (ref 11.5–15.5)
WBC: 15.3 10*3/uL — ABNORMAL HIGH (ref 4.0–10.5)

## 2016-12-01 LAB — TYPE AND SCREEN
ABO/RH(D): O POS
ANTIBODY SCREEN: NEGATIVE

## 2016-12-01 NOTE — Patient Instructions (Signed)
18 Alexis Meyer  12/01/2016   Your procedure is scheduled on:  12/04/2016  Enter through the Main Entrance of [facility] up the phone at the desk and dial [phone].   Call this number if you have problems the morning of surgery: [phone]   Remember:   Do not eat food:After Midnight.  Do not drink clear liquids: After Midnight.  Take these medicines the morning of surgery with A SIP OF WATER: none   Do not wear jewelry, make-up or nail polish.  Do not wear lotions, powders, or perfumes. Do not wear deodorant.  Do not shave 48 hours prior to surgery.  Do not bring valuables to the hospital.  [facility] is not   responsible for any belongings or valuables brought to the hospital.  Contacts, dentures or bridgework may not be worn into surgery.  Leave suitcase in the car. After surgery it may be brought to your room.  For patients admitted to the hospital, checkout time is 11:00 AM the day of              discharge.   Patients discharged the day of surgery will not be allowed to drive             home.  Name and phone number of your driver: na  Special Instructions:   N/A   Please read over the following fact sheets that you were given:   Surgical Site Infection Prevention

## 2016-12-02 LAB — RPR: RPR: NONREACTIVE

## 2016-12-03 ENCOUNTER — Other Ambulatory Visit: Payer: Self-pay | Admitting: Obstetrics and Gynecology

## 2016-12-03 NOTE — H&P (Deleted)
  The note originally documented on this encounter has been moved the the encounter in which it belongs.  

## 2016-12-03 NOTE — Anesthesia Preprocedure Evaluation (Signed)
Anesthesia Evaluation  Patient identified by MRN, date of birth, ID band Patient awake    Reviewed: Allergy & Precautions, NPO status , Patient's Chart, lab work & pertinent test results  History of Anesthesia Complications Negative for: history of anesthetic complications  Airway Mallampati: III  TM Distance: >3 FB Neck ROM: Full    Dental no notable dental hx. (+) Dental Advisory Given   Pulmonary neg pulmonary ROS,    Pulmonary exam normal breath sounds clear to auscultation       Cardiovascular negative cardio ROS Normal cardiovascular exam Rhythm:Regular Rate:Normal     Neuro/Psych PSYCHIATRIC DISORDERS Anxiety Depression Bipolar Disorder negative neurological ROS     GI/Hepatic Neg liver ROS, GERD  ,  Endo/Other  Morbid obesity  Renal/GU negative Renal ROS  negative genitourinary   Musculoskeletal negative musculoskeletal ROS (+)   Abdominal   Peds negative pediatric ROS (+)  Hematology negative hematology ROS (+)   Anesthesia Other Findings   Reproductive/Obstetrics negative OB ROS                             Anesthesia Physical  Anesthesia Plan  ASA: III  Anesthesia Plan: Spinal   Post-op Pain Management:    Induction:   PONV Risk Score and Plan: 2 and Ondansetron, Dexamethasone and Metaclopromide  Airway Management Planned:   Additional Equipment:   Intra-op Plan:   Post-operative Plan: Extubation in OR  Informed Consent: I have reviewed the patients History and Physical, chart, labs and discussed the procedure including the risks, benefits and alternatives for the proposed anesthesia with the patient or authorized representative who has indicated his/her understanding and acceptance.   Dental advisory given  Plan Discussed with: CRNA  Anesthesia Plan Comments:         Anesthesia Quick Evaluation

## 2016-12-03 NOTE — H&P (Signed)
Alexis Meyer is a 31 y.o. female presenting for primary cesarean section at 37 wks due to h/o myomectomy. 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 Prenatal care provided by Dr. Christophe Meyer with [facility].   . OB History    Gravida Para Term Preterm AB Living   4       2     SAB TAB Ectopic Multiple Live Births   2             Past Medical History:  Diagnosis Date  . Anxiety   . Bipolar disorder (Alexis Meyer)    has RX but doesn't take it  . Depression   . GERD (gastroesophageal reflux disease)   . Herpes   . Urinary tract infection    Past Surgical History:  Procedure Laterality Date  . FRACTURE SURGERY     left wrist  . ROBOT ASSISTED MYOMECTOMY N/A 05/26/2015   Procedure: ROBOTIC ASSISTED MYOMECTOMY;  Surgeon: Alexis Specking, MD;  Location: [place] [facility];  Service: Gynecology;  Laterality: N/A;  Dr. MODY will assist   . WRIST SURGERY     Family History: family history is not on file. She was adopted. Social History:  reports that she has never smoked. She has never used smokeless tobacco. She reports that she drinks about 0.6 oz of alcohol per week . She reports that she does not use drugs.     Maternal Diabetes: No Genetic Screening: Abnormal:  Results: Elevated risk of Trisomy 21 Maternal Ultrasounds/Referrals: Normal Fetal Ultrasounds or other Referrals:  None Maternal Substance Abuse:  No Significant Maternal Medications:  Meds include: Other:  Significant Maternal Lab Results:  Lab values include: Group B Strep negative Other Comments:  None Panorama low risk although quad screen abnormal increased r/o trisomy 21 1 out of 248  Review of Systems  Constitutional: Negative.   HENT: Negative.   Eyes: Negative.   Respiratory: Negative.   Cardiovascular: Negative.   Gastrointestinal: Negative.   Genitourinary: Negative.   Musculoskeletal: Negative.   Skin: Negative.   Neurological: Negative.   Endo/Heme/Allergies: Negative.    Psychiatric/Behavioral: Negative.    Maternal Medical History:  Fetal activity: Perceived fetal activity is normal.    Prenatal complications: no prenatal complications Prenatal Complications - Diabetes: none.      Last menstrual period 03/26/2016. Exam Physical Exam  Vitals reviewed. Constitutional: She is oriented to person, place, and time. She appears well-developed and well-nourished.  HENT:  Head: Normocephalic and atraumatic.  Eyes: Conjunctivae are normal. Pupils are equal, round, and reactive to light.  Neck: Normal range of motion.  Cardiovascular: Normal rate and regular rhythm.   Respiratory: Effort normal and breath sounds normal.  GI: Soft. There is no tenderness.  Genitourinary: Vagina normal.  Musculoskeletal: Normal range of motion.  Neurological: She is alert and oriented to person, place, and time. She has normal reflexes.  Skin: Skin is warm and dry.  Psychiatric: She has a normal mood and affect.    Prenatal labs: ABO, Rh: --/--/O POS (06/29 1100) Antibody: NEG (06/29 1100) Rubella: Immune (12/19 0000) RPR: Non Reactive (06/29 1100)  HBsAg: Negative (12/19 0000)  HIV: Non-reactive (12/19 0000)  GBS:   Negative   Assessment/Plan: -37 wks and 0 days with h/o myomectomy for primary cesarean section. R/B/A of cesarean section including but not limited to infection / bleeding damage to bowel bladder and baby with the need for further surgery discussed. R/o transfuion discussed. Pt voiced understanding and desires  to proceed.  -HSV 2 - no active infection -Bipolar disorder- pt no currently on medication. PPV in 2 wks to evaluate for postpartum depression    Alexis Meyer J. 12/03/2016, 9:35 PM

## 2016-12-04 ENCOUNTER — Inpatient Hospital Stay (HOSPITAL_COMMUNITY): Payer: 59 | Admitting: Certified Registered Nurse Anesthetist

## 2016-12-04 ENCOUNTER — Inpatient Hospital Stay (HOSPITAL_COMMUNITY)
Admission: RE | Admit: 2016-12-04 | Discharge: 2016-12-07 | DRG: 765 | Disposition: A | Discharge status: Home / Self Care | Payer: 59 | Source: Ambulatory Visit | Attending: Obstetrics and Gynecology | Admitting: Obstetrics and Gynecology

## 2016-12-04 ENCOUNTER — Encounter (HOSPITAL_COMMUNITY)
Admission: RE | Disposition: A | Discharge status: Home / Self Care | Payer: Self-pay | Source: Ambulatory Visit | Attending: Obstetrics and Gynecology

## 2016-12-04 ENCOUNTER — Encounter (HOSPITAL_COMMUNITY): Payer: Self-pay | Admitting: General Practice

## 2016-12-04 DIAGNOSIS — O99344 Other mental disorders complicating childbirth: Secondary | ICD-10-CM | POA: Diagnosis present

## 2016-12-04 DIAGNOSIS — Z3A36 36 weeks gestation of pregnancy: Secondary | ICD-10-CM

## 2016-12-04 DIAGNOSIS — O3429 Maternal care due to uterine scar from other previous surgery: Principal | ICD-10-CM | POA: Diagnosis present

## 2016-12-04 DIAGNOSIS — O99214 Obesity complicating childbirth: Secondary | ICD-10-CM | POA: Diagnosis present

## 2016-12-04 DIAGNOSIS — F319 Bipolar disorder, unspecified: Secondary | ICD-10-CM | POA: Diagnosis present

## 2016-12-04 DIAGNOSIS — Z98891 History of uterine scar from previous surgery: Secondary | ICD-10-CM

## 2016-12-04 DIAGNOSIS — F419 Anxiety disorder, unspecified: Secondary | ICD-10-CM | POA: Diagnosis present

## 2016-12-04 DIAGNOSIS — Z3A00 Weeks of gestation of pregnancy not specified: Secondary | ICD-10-CM | POA: Diagnosis not present

## 2016-12-04 DIAGNOSIS — Z6841 Body Mass Index (BMI) 40.0 and over, adult: Secondary | ICD-10-CM

## 2016-12-04 DIAGNOSIS — Z3A37 37 weeks gestation of pregnancy: Secondary | ICD-10-CM | POA: Diagnosis not present

## 2016-12-04 SURGERY — Surgical Case
Anesthesia: Spinal

## 2016-12-04 MED ORDER — DEXAMETHASONE SODIUM PHOSPHATE 4 MG/ML IJ SOLN
INTRAMUSCULAR | Status: AC
  Filled 2016-12-04: qty 1

## 2016-12-04 MED ORDER — MENTHOL 3 MG MT LOZG: 1.0000 | LOZENGE | OROMUCOSAL | Status: DC | PRN

## 2016-12-04 MED ORDER — SODIUM CHLORIDE 0.9 % IR SOLN
Status: DC | PRN
  Administered 2016-12-04: 1

## 2016-12-04 MED ORDER — DIPHENHYDRAMINE HCL 25 MG PO CAPS
25.0000 mg | ORAL_CAPSULE | ORAL | Status: DC | PRN
  Filled 2016-12-04: qty 1

## 2016-12-04 MED ORDER — DIPHENHYDRAMINE HCL 25 MG PO CAPS: 25.0000 mg | ORAL_CAPSULE | ORAL | Status: DC | PRN

## 2016-12-04 MED ORDER — FERROUS SULFATE 325 (65 FE) MG PO TABS
325.0000 mg | ORAL_TABLET | Freq: Two times a day (BID) | ORAL | Status: DC
  Administered 2016-12-05 – 2016-12-07 (×5): 325 mg via ORAL
  Filled 2016-12-04 (×5): qty 1

## 2016-12-04 MED ORDER — NALOXONE HCL 0.4 MG/ML IJ SOLN: 0.4000 mg | INTRAMUSCULAR | Status: DC | PRN

## 2016-12-04 MED ORDER — OXYTOCIN 40 UNITS IN LACTATED RINGERS INFUSION - SIMPLE MED
2.5000 [IU]/h | INTRAVENOUS | Status: AC
  Administered 2016-12-04: 2.5 [IU]/h via INTRAVENOUS
  Filled 2016-12-04: qty 1000

## 2016-12-04 MED ORDER — COCONUT OIL OIL
1.0000 "application " | TOPICAL_OIL | Status: DC | PRN
  Administered 2016-12-07: 1 via TOPICAL
  Filled 2016-12-04: qty 120

## 2016-12-04 MED ORDER — NALOXONE HCL 2 MG/2ML IJ SOSY
1.0000 ug/kg/h | PREFILLED_SYRINGE | INTRAMUSCULAR | Status: DC | PRN
  Filled 2016-12-04: qty 2

## 2016-12-04 MED ORDER — DIPHENHYDRAMINE HCL 50 MG/ML IJ SOLN: 12.5000 mg | INTRAMUSCULAR | Status: DC | PRN

## 2016-12-04 MED ORDER — NALOXONE HCL 2 MG/2ML IJ SOSY
1.0000 ug/kg/h | PREFILLED_SYRINGE | INTRAVENOUS | Status: DC | PRN
  Filled 2016-12-04: qty 2

## 2016-12-04 MED ORDER — MEPERIDINE HCL 25 MG/ML IJ SOLN: 6.2500 mg | INTRAMUSCULAR | Status: DC | PRN

## 2016-12-04 MED ORDER — IBUPROFEN 600 MG PO TABS: 600.0000 mg | ORAL_TABLET | Freq: Four times a day (QID) | ORAL | Status: DC

## 2016-12-04 MED ORDER — TRIAMCINOLONE ACETONIDE 40 MG/ML IJ SUSP
40.0000 mg | Freq: Once | INTRAMUSCULAR | Status: DC
  Filled 2016-12-04: qty 1

## 2016-12-04 MED ORDER — BUPIVACAINE IN DEXTROSE 0.75-8.25 % IT SOLN
INTRATHECAL | Status: AC
  Filled 2016-12-04: qty 2

## 2016-12-04 MED ORDER — LACTATED RINGERS IV SOLN
INTRAVENOUS | Status: DC
  Administered 2016-12-04: 21:00:00 via INTRAVENOUS

## 2016-12-04 MED ORDER — CEFAZOLIN SODIUM-DEXTROSE 2-4 GM/100ML-% IV SOLN
2.0000 g | INTRAVENOUS | Status: AC
  Administered 2016-12-04: 3 g via INTRAVENOUS

## 2016-12-04 MED ORDER — METOCLOPRAMIDE HCL 5 MG/ML IJ SOLN
INTRAMUSCULAR | Status: AC
  Filled 2016-12-04: qty 2

## 2016-12-04 MED ORDER — BUPIVACAINE HCL (PF) 0.25 % IJ SOLN
INTRAMUSCULAR | Status: AC
  Filled 2016-12-04: qty 30

## 2016-12-04 MED ORDER — KETOROLAC TROMETHAMINE 30 MG/ML IJ SOLN
30.0000 mg | Freq: Four times a day (QID) | INTRAMUSCULAR | Status: DC | PRN
  Administered 2016-12-04: 30 mg via INTRAMUSCULAR

## 2016-12-04 MED ORDER — SOD CITRATE-CITRIC ACID 500-334 MG/5ML PO SOLN
30.0000 mL | Freq: Once | ORAL | Status: DC
  Filled 2016-12-04: qty 15

## 2016-12-04 MED ORDER — ZOLPIDEM TARTRATE 5 MG PO TABS: 5.0000 mg | ORAL_TABLET | Freq: Every evening | ORAL | Status: DC | PRN

## 2016-12-04 MED ORDER — TRIAMCINOLONE ACETONIDE 40 MG/ML IJ SUSP
INTRAMUSCULAR | Status: DC | PRN
  Administered 2016-12-04: 1 mL via SUBCUTANEOUS

## 2016-12-04 MED ORDER — OXYCODONE HCL 5 MG PO TABS
5.0000 mg | ORAL_TABLET | ORAL | Status: DC | PRN
  Administered 2016-12-05 – 2016-12-07 (×3): 5 mg via ORAL
  Filled 2016-12-04 (×3): qty 1

## 2016-12-04 MED ORDER — ONDANSETRON HCL 4 MG/2ML IJ SOLN: 4.0000 mg | Freq: Three times a day (TID) | INTRAMUSCULAR | Status: DC | PRN

## 2016-12-04 MED ORDER — METHYLERGONOVINE MALEATE 0.2 MG PO TABS: 0.2000 mg | ORAL_TABLET | ORAL | Status: DC | PRN

## 2016-12-04 MED ORDER — IBUPROFEN 600 MG PO TABS
600.0000 mg | ORAL_TABLET | Freq: Four times a day (QID) | ORAL | Status: DC
  Administered 2016-12-04 – 2016-12-07 (×11): 600 mg via ORAL
  Filled 2016-12-04 (×11): qty 1

## 2016-12-04 MED ORDER — FENTANYL CITRATE (PF) 100 MCG/2ML IJ SOLN
INTRAMUSCULAR | Status: AC
  Filled 2016-12-04: qty 2

## 2016-12-04 MED ORDER — SODIUM CHLORIDE 0.9% FLUSH: 3.0000 mL | INTRAVENOUS | Status: DC | PRN

## 2016-12-04 MED ORDER — DEXAMETHASONE SODIUM PHOSPHATE 4 MG/ML IJ SOLN
INTRAMUSCULAR | Status: DC | PRN
  Administered 2016-12-04: 4 mg via INTRAVENOUS

## 2016-12-04 MED ORDER — SCOPOLAMINE 1 MG/3DAYS TD PT72: 1.0000 | MEDICATED_PATCH | Freq: Once | TRANSDERMAL | Status: DC

## 2016-12-04 MED ORDER — SCOPOLAMINE 1 MG/3DAYS TD PT72
1.0000 | MEDICATED_PATCH | Freq: Once | TRANSDERMAL | Status: DC
  Administered 2016-12-04: 1.5 mg via TRANSDERMAL
  Filled 2016-12-04: qty 1

## 2016-12-04 MED ORDER — KETOROLAC TROMETHAMINE 30 MG/ML IJ SOLN: 30.0000 mg | Freq: Four times a day (QID) | INTRAMUSCULAR | Status: AC | PRN

## 2016-12-04 MED ORDER — NALBUPHINE HCL 10 MG/ML IJ SOLN
5.0000 mg | INTRAMUSCULAR | Status: DC | PRN
  Filled 2016-12-04: qty 0.5

## 2016-12-04 MED ORDER — PRENATAL MULTIVITAMIN CH
1.0000 | ORAL_TABLET | Freq: Every day | ORAL | Status: DC
  Administered 2016-12-05 – 2016-12-06 (×2): 1 via ORAL
  Filled 2016-12-04 (×2): qty 1

## 2016-12-04 MED ORDER — SIMETHICONE 80 MG PO CHEW
80.0000 mg | CHEWABLE_TABLET | ORAL | Status: DC
  Administered 2016-12-04 – 2016-12-07 (×2): 80 mg via ORAL
  Filled 2016-12-04 (×3): qty 1

## 2016-12-04 MED ORDER — PHENYLEPHRINE 40 MCG/ML (10ML) SYRINGE FOR IV PUSH (FOR BLOOD PRESSURE SUPPORT)
PREFILLED_SYRINGE | INTRAVENOUS | Status: AC
  Filled 2016-12-04: qty 10

## 2016-12-04 MED ORDER — KETOROLAC TROMETHAMINE 30 MG/ML IJ SOLN
INTRAMUSCULAR | Status: AC
  Filled 2016-12-04: qty 1

## 2016-12-04 MED ORDER — NALBUPHINE HCL 10 MG/ML IJ SOLN
5.0000 mg | Freq: Once | INTRAMUSCULAR | Status: DC | PRN
  Filled 2016-12-04: qty 0.5

## 2016-12-04 MED ORDER — ONDANSETRON HCL 4 MG/2ML IJ SOLN
INTRAMUSCULAR | Status: AC
  Filled 2016-12-04: qty 2

## 2016-12-04 MED ORDER — KETOROLAC TROMETHAMINE 30 MG/ML IJ SOLN: 30.0000 mg | Freq: Four times a day (QID) | INTRAMUSCULAR | Status: DC | PRN

## 2016-12-04 MED ORDER — OXYTOCIN 40 UNITS IN LACTATED RINGERS INFUSION - SIMPLE MED
INTRAVENOUS | Status: DC | PRN
  Administered 2016-12-04: 40 mL via INTRAVENOUS

## 2016-12-04 MED ORDER — FENTANYL CITRATE (PF) 100 MCG/2ML IJ SOLN
INTRAMUSCULAR | Status: DC | PRN
  Administered 2016-12-04: 20 ug via INTRATHECAL

## 2016-12-04 MED ORDER — OXYCODONE HCL 5 MG PO TABS: 10.0000 mg | ORAL_TABLET | ORAL | Status: DC | PRN

## 2016-12-04 MED ORDER — MORPHINE SULFATE (PF) 0.5 MG/ML IJ SOLN
INTRAMUSCULAR | Status: AC
  Filled 2016-12-04: qty 10

## 2016-12-04 MED ORDER — BUPIVACAINE HCL (PF) 0.25 % IJ SOLN
10.0000 mL | Freq: Once | INTRAMUSCULAR | Status: DC
  Filled 2016-12-04: qty 10

## 2016-12-04 MED ORDER — METOCLOPRAMIDE HCL 5 MG/ML IJ SOLN
INTRAMUSCULAR | Status: DC | PRN
  Administered 2016-12-04: 10 mg via INTRAVENOUS

## 2016-12-04 MED ORDER — SOD CITRATE-CITRIC ACID 500-334 MG/5ML PO SOLN
30.0000 mL | ORAL | Status: AC
  Administered 2016-12-04: 30 mL via ORAL

## 2016-12-04 MED ORDER — PHENYLEPHRINE 8 MG IN D5W 100 ML (0.08MG/ML) PREMIX OPTIME
INJECTION | INTRAVENOUS | Status: DC | PRN
  Administered 2016-12-04: 60 ug/min via INTRAVENOUS

## 2016-12-04 MED ORDER — NALBUPHINE HCL 10 MG/ML IJ SOLN
INTRAMUSCULAR | Status: AC
  Filled 2016-12-04: qty 1

## 2016-12-04 MED ORDER — WITCH HAZEL-GLYCERIN EX PADS: 1.0000 "application " | MEDICATED_PAD | CUTANEOUS | Status: DC | PRN

## 2016-12-04 MED ORDER — SCOPOLAMINE 1 MG/3DAYS TD PT72
1.0000 | MEDICATED_PATCH | Freq: Once | TRANSDERMAL | Status: DC
  Filled 2016-12-04: qty 1

## 2016-12-04 MED ORDER — SENNOSIDES-DOCUSATE SODIUM 8.6-50 MG PO TABS
2.0000 | ORAL_TABLET | ORAL | Status: DC
  Administered 2016-12-04 – 2016-12-07 (×3): 2 via ORAL
  Filled 2016-12-04 (×4): qty 2

## 2016-12-04 MED ORDER — OXYTOCIN 10 UNIT/ML IJ SOLN
INTRAMUSCULAR | Status: AC
  Filled 2016-12-04: qty 4

## 2016-12-04 MED ORDER — MORPHINE SULFATE (PF) 0.5 MG/ML IJ SOLN
INTRAMUSCULAR | Status: DC | PRN
  Administered 2016-12-04: .2 mg via INTRATHECAL

## 2016-12-04 MED ORDER — PHENYLEPHRINE HCL 10 MG/ML IJ SOLN
INTRAMUSCULAR | Status: DC | PRN
  Administered 2016-12-04: 80 ug via INTRAVENOUS

## 2016-12-04 MED ORDER — SIMETHICONE 80 MG PO CHEW
80.0000 mg | CHEWABLE_TABLET | Freq: Three times a day (TID) | ORAL | Status: DC
  Administered 2016-12-04 – 2016-12-07 (×9): 80 mg via ORAL
  Filled 2016-12-04 (×7): qty 1

## 2016-12-04 MED ORDER — LACTATED RINGERS IV SOLN
INTRAVENOUS | Status: DC
  Administered 2016-12-04 (×3): via INTRAVENOUS

## 2016-12-04 MED ORDER — NALBUPHINE HCL 10 MG/ML IJ SOLN
5.0000 mg | INTRAMUSCULAR | Status: DC | PRN
  Administered 2016-12-04: 5 mg via SUBCUTANEOUS
  Filled 2016-12-04: qty 0.5

## 2016-12-04 MED ORDER — ACETAMINOPHEN 325 MG PO TABS
650.0000 mg | ORAL_TABLET | ORAL | Status: DC | PRN
  Administered 2016-12-04 – 2016-12-06 (×5): 650 mg via ORAL
  Filled 2016-12-04 (×5): qty 2

## 2016-12-04 MED ORDER — BUPIVACAINE IN DEXTROSE 0.75-8.25 % IT SOLN
INTRATHECAL | Status: DC | PRN
  Administered 2016-12-04: 12 mg via INTRATHECAL

## 2016-12-04 MED ORDER — DIPHENHYDRAMINE HCL 25 MG PO CAPS: 25.0000 mg | ORAL_CAPSULE | Freq: Four times a day (QID) | ORAL | Status: DC | PRN

## 2016-12-04 MED ORDER — DIBUCAINE 1 % RE OINT: 1.0000 "application " | TOPICAL_OINTMENT | RECTAL | Status: DC | PRN

## 2016-12-04 MED ORDER — PHENYLEPHRINE 8 MG IN D5W 100 ML (0.08MG/ML) PREMIX OPTIME
INJECTION | INTRAVENOUS | Status: AC
  Filled 2016-12-04: qty 100

## 2016-12-04 MED ORDER — METHYLERGONOVINE MALEATE 0.2 MG/ML IJ SOLN: 0.2000 mg | INTRAMUSCULAR | Status: DC | PRN

## 2016-12-04 MED ORDER — LACTATED RINGERS IV SOLN
INTRAVENOUS | Status: DC
  Administered 2016-12-04: 08:00:00 via INTRAVENOUS

## 2016-12-04 MED ORDER — ONDANSETRON HCL 4 MG/2ML IJ SOLN
INTRAMUSCULAR | Status: DC | PRN
  Administered 2016-12-04: 4 mg via INTRAVENOUS

## 2016-12-04 MED ORDER — SIMETHICONE 80 MG PO CHEW
80.0000 mg | CHEWABLE_TABLET | ORAL | Status: DC | PRN
  Administered 2016-12-05: 80 mg via ORAL
  Filled 2016-12-04: qty 1

## 2016-12-04 MED ORDER — DEXTROSE 5 % IV SOLN
INTRAVENOUS | Status: AC
  Filled 2016-12-04: qty 3000

## 2016-12-04 SURGICAL SUPPLY — 45 items
BARRIER ADHS 3X4 INTERCEED (GAUZE/BANDAGES/DRESSINGS) ×3 IMPLANT
BENZOIN TINCTURE PRP APPL 2/3 (GAUZE/BANDAGES/DRESSINGS) ×3 IMPLANT
CHLORAPREP W/TINT 26ML (MISCELLANEOUS) ×3 IMPLANT
CLAMP CORD UMBIL (MISCELLANEOUS) IMPLANT
CLOSURE STERI STRIP 1/2 X4 (GAUZE/BANDAGES/DRESSINGS) ×3 IMPLANT
CLOTH BEACON ORANGE TIMEOUT ST (SAFETY) ×3 IMPLANT
CONTAINER PREFILL 10% NBF 15ML (MISCELLANEOUS) IMPLANT
DECANTER SPIKE VIAL GLASS SM (MISCELLANEOUS) ×3 IMPLANT
DRSG OPSITE POSTOP 4X10 (GAUZE/BANDAGES/DRESSINGS) ×3 IMPLANT
ELECT REM PT RETURN 9FT ADLT (ELECTROSURGICAL) ×3
ELECTRODE REM PT RTRN 9FT ADLT (ELECTROSURGICAL) ×1 IMPLANT
EXTRACTOR VACUUM KIWI (MISCELLANEOUS) IMPLANT
GAUZE SPONGE 4X4 12PLY STRL LF (GAUZE/BANDAGES/DRESSINGS) ×6 IMPLANT
GLOVE BIOGEL M 6.5 STRL (GLOVE) ×6 IMPLANT
GLOVE BIOGEL PI IND STRL 6.5 (GLOVE) ×1 IMPLANT
GLOVE BIOGEL PI IND STRL 7.0 (GLOVE) ×1 IMPLANT
GLOVE BIOGEL PI INDICATOR 6.5 (GLOVE) ×2
GLOVE BIOGEL PI INDICATOR 7.0 (GLOVE) ×2
GOWN STRL REUS W/TWL LRG LVL3 (GOWN DISPOSABLE) ×9 IMPLANT
KIT ABG SYR 3ML LUER SLIP (SYRINGE) IMPLANT
NDL SAFETY ECLIPSE 18X1.5 (NEEDLE) ×2 IMPLANT
NEEDLE HYPO 18GX1.5 SHARP (NEEDLE) ×4
NEEDLE HYPO 25X5/8 SAFETYGLIDE (NEEDLE) IMPLANT
NS IRRIG 1000ML POUR BTL (IV SOLUTION) ×3 IMPLANT
PACK C SECTION WH (CUSTOM PROCEDURE TRAY) ×3 IMPLANT
PAD ABD 7.5X8 STRL (GAUZE/BANDAGES/DRESSINGS) ×3 IMPLANT
PAD OB MATERNITY 4.3X12.25 (PERSONAL CARE ITEMS) ×3 IMPLANT
PENCIL SMOKE EVAC W/HOLSTER (ELECTROSURGICAL) ×3 IMPLANT
RTRCTR C-SECT PINK 25CM LRG (MISCELLANEOUS) IMPLANT
SPONGE LAP 18X18 X RAY DECT (DISPOSABLE) ×6 IMPLANT
STRIP CLOSURE SKIN 1/2X4 (GAUZE/BANDAGES/DRESSINGS) ×2 IMPLANT
SUT PDS AB 0 CT1 27 (SUTURE) ×9 IMPLANT
SUT PLAIN 0 NONE (SUTURE) IMPLANT
SUT VIC AB 0 CTX 36 (SUTURE) ×10
SUT VIC AB 0 CTX36XBRD ANBCTRL (SUTURE) ×5 IMPLANT
SUT VIC AB 2-0 CT1 (SUTURE) ×3 IMPLANT
SUT VIC AB 2-0 CT1 27 (SUTURE) ×2
SUT VIC AB 2-0 CT1 TAPERPNT 27 (SUTURE) ×1 IMPLANT
SUT VIC AB 3-0 SH 27 (SUTURE)
SUT VIC AB 3-0 SH 27X BRD (SUTURE) IMPLANT
SUT VIC AB 4-0 KS 27 (SUTURE) ×3 IMPLANT
SYR 5ML LL (SYRINGE) ×3 IMPLANT
SYR CONTROL 10ML LL (SYRINGE) ×3 IMPLANT
TOWEL OR 17X24 6PK STRL BLUE (TOWEL DISPOSABLE) ×3 IMPLANT
TRAY FOLEY BAG SILVER LF 14FR (SET/KITS/TRAYS/PACK) ×3 IMPLANT

## 2016-12-04 NOTE — Anesthesia Postprocedure Evaluation (Signed)
Anesthesia Post Note  Patient: Alexis Meyer  Procedure(s) Performed: Procedure(s) (LRB): CESAREAN SECTION (N/A)     Patient location during evaluation: PACU Anesthesia Type: Spinal Level of consciousness: oriented and awake and alert Pain management: pain level controlled Vital Signs Assessment: post-procedure vital signs reviewed and stable Respiratory status: spontaneous breathing, respiratory function stable and patient connected to nasal cannula oxygen Cardiovascular status: blood pressure returned to baseline and stable Postop Assessment: no headache and no backache Anesthetic complications: no    Last Vitals:  Vitals:   12/04/16 0922 12/04/16 0923  BP:    Pulse: 82 88  Resp: (!) 21 19  Temp:      Last Pain:  Vitals:   12/04/16 0542  TempSrc: Oral   Pain Goal:                 Rolla Kedzierski

## 2016-12-04 NOTE — Op Note (Signed)
Cesarean Section Procedure Note  Indications: previous myomectomy  Pre-operative Diagnosis: 37 week 0 day pregnancy.  Post-operative Diagnosis: same  Surgeon: Catha Brow.   Assistants: Dr. Laveda Abbe assisted due to concern for adhesive disease  Anesthesia: Spinal anesthesia  ASA Class: 2   Procedure Details   The patient was seen in the Holding Room. The risks, benefits, complications, treatment options, and expected outcomes were discussed with the patient.  The patient concurred with the proposed plan, giving informed consent.  The site of surgery properly noted/marked. The patient was taken to Operating Room # 1, identified as Alexis Meyer and the procedure verified as C-Section Delivery. A Time Out was held and the above information confirmed.  After induction of anesthesia, the patient was draped and prepped in the usual sterile manner. A Pfannenstiel incision was made and carried down through the subcutaneous tissue to the fascia. Fascial incision was made and extended transversely. The fascia was separated from the underlying rectus tissue superiorly and inferiorly. The peritoneum was identified and entered. Peritoneal incision was extended longitudinally. The utero-vesical peritoneal reflection was incised transversely and the bladder flap was bluntly freed from the lower uterine segment. A low transverse uterine incision was made. Delivered from cephalic presentation using Kiwi Vacuum  was a   Female with Apgar scores of 8 at one minute and 9 at five minutes. After the umbilical cord was clamped and cut cord blood was obtained for evaluation. The placenta was removed intact and appeared normal. The uterine outline, tubes and ovaries appeared normal. The uterine incision was closed with running locked sutures of 0 vicryl.. A second layer of 0 vicryl was used to imbricate the incision . Hemostasis was observed. Lavage was carried out until clear. Interseed was placed along the  uterine incision.  The fascia was then reapproximated with running sutures of 0 pds . The skin was reapproximated with 4-0 vicryl . A mixture of kenalog and marcaine was injected above the incision.   Instrument, sponge, and needle counts were correct prior the abdominal closure and at the conclusion of the case.   Findings: Female infant in cephalic presentation,. Normal fallopian tubes and ovaries   Estimated Blood Loss:  700 ML         Drains: None         Total IV Fluids:  3600 ml         Specimens: Placenta sent to Labor and Delivery           Implants: None         Complications:  None; patient tolerated the procedure well.         Disposition: PACU - hemodynamically stable.         Condition: stable  Attending Attestation: I performed the procedure.

## 2016-12-04 NOTE — Anesthesia Postprocedure Evaluation (Signed)
Anesthesia Post Note  Patient: Alexis Meyer  Procedure(s) Performed: Procedure(s) (LRB): CESAREAN SECTION (N/A)     Patient location during evaluation: Mother Baby Anesthesia Type: Spinal Level of consciousness: awake and alert Pain management: pain level controlled Vital Signs Assessment: post-procedure vital signs reviewed and stable Respiratory status: spontaneous breathing Cardiovascular status: blood pressure returned to baseline Postop Assessment: no headache, no backache, patient able to bend at knees, no signs of nausea or vomiting, spinal receding and adequate PO intake Anesthetic complications: no    Last Vitals:  Vitals:   12/04/16 1259 12/04/16 1400  BP: 128/75   Pulse: 82   Resp: 19 18  Temp: 36.8 C 37.2 C    Last Pain:  Vitals:   12/04/16 1438  TempSrc:   PainSc: 0-No pain   Pain Goal:                 Ailene Ards

## 2016-12-04 NOTE — Anesthesia Procedure Notes (Signed)
Spinal  Patient location during procedure: OR Start time: 12/04/2016 7:32 AM End time: 12/04/2016 7:41 AM Staffing Anesthesiologist: Natalye Kott Preanesthetic Checklist Completed: patient identified, site marked, surgical consent, pre-op evaluation, timeout performed, IV checked, risks and benefits discussed and monitors and equipment checked Spinal Block Patient position: sitting Prep: DuraPrep Patient monitoring: heart rate, cardiac monitor, continuous pulse ox and blood pressure Approach: midline Location: L4-5 Injection technique: single-shot Needle Needle type: Whitacre  Needle gauge: 25 G Needle length: 9 cm Assessment Sensory level: T6 Additional Notes +csf /dose/+csf

## 2016-12-04 NOTE — Lactation Note (Signed)
This note was copied from a baby's chart. Lactation Consultation Note  Patient Name: Alexis Meyer [id number] Date: 12/04/2016 Reason for consult: Initial assessment  Initial visit at 11 hours of life. Multiple latches had been attempted, but without success. LPI policy initiated; parents know infant needs to feed q3hrs. Hand expression taught to Mom. Drops of colostrum were given to "[name]" on gloved finger. Paced bottle-feeding was taught to Dad (Mom was given choice of bottle versus cup feeding; Mom chose bottle feeding).   Nothing was yielded when Mom used the DEBP for the 1st time. Mom reported that breast changes during pregnancy consisted only of nipple/areola darkening & increased nipple sensitivity. She denied change in size/shape of breasts.   Mom to pump q3h for 15 minutes on the preemie setting. Mom shown how to assemble & use hand pump that was included in pump kit.   Matthias Hughs [facility] 12/04/2016, 8:21 PM

## 2016-12-04 NOTE — Addendum Note (Signed)
Addendum  created 12/04/16 1402 by Flossie Dibble, CRNA   Sign clinical note

## 2016-12-04 NOTE — Transfer of Care (Addendum)
Immediate Anesthesia Transfer of Care Note  Patient: Alexis Meyer  Procedure(s) Performed: Procedure(s) with comments: CESAREAN SECTION (N/A) - EDD 12/24/16  Patient Location: PACU  Anesthesia Type:Spinal  Level of Consciousness: awake, alert  and oriented  Airway & Oxygen Therapy: Patient Spontanous Breathing  Post-op Assessment: Report given to RN and Post -op Vital signs reviewed and stable  Post vital signs: Reviewed and stable  Last Vitals:   Last Pain:  Vitals:   12/04/16 1259  TempSrc: Oral         Complications: No apparent anesthesia complications

## 2016-12-04 NOTE — H&P (Signed)
Date of Initial H&P: 12/03/2016 History reviewed, patient examined, no change in status, stable for surgery.

## 2016-12-05 LAB — CBC
HCT: 30.3 % — ABNORMAL LOW (ref 36.0–46.0)
Hemoglobin: 9.9 g/dL — ABNORMAL LOW (ref 12.0–15.0)
MCH: 30.8 pg (ref 26.0–34.0)
MCHC: 32.7 g/dL (ref 30.0–36.0)
MCV: 94.4 fL (ref 78.0–100.0)
PLATELETS: 192 10*3/uL (ref 150–400)
RBC: 3.21 MIL/uL — AB (ref 3.87–5.11)
RDW: 16.2 % — ABNORMAL HIGH (ref 11.5–15.5)
WBC: 16.2 10*3/uL — ABNORMAL HIGH (ref 4.0–10.5)

## 2016-12-05 LAB — BIRTH TISSUE RECOVERY COLLECTION (PLACENTA DONATION)

## 2016-12-05 NOTE — Lactation Note (Signed)
This note was copied from a baby's chart. Lactation Consultation Note: Mother reports that infant breastfed for 10  mins and then she gave infant 13 ml of formula. Reviewed LPI guidelines for supplementing infant. Suggested that mother could offer more volume. Mother reports that she is unable to get any colostrum when she pumps. Advised mother to continue to post pump after each breastfeeding. Reassured mother that she will begin to get milk flow started. Mother reports that staff nurse assist with latch and she was able to hear infant swallow. 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 Advised mother to look at the website and decide which pump she wants.   Patient Name: Alexis Meyer [id number] Date: 12/05/2016 Reason for consult: Follow-up assessment   Maternal Data    Feeding Feeding Type: Bottle Fed - Formula Nipple Type: Slow - flow Length of feed: 10 min  LATCH Score/Interventions Latch: Repeated attempts needed to sustain latch, nipple held in mouth throughout feeding, stimulation needed to elicit sucking reflex. Intervention(s): Skin to skin;Waking techniques Intervention(s): Adjust position;Assist with latch;Breast massage;Breast compression  Audible Swallowing: A few with stimulation Intervention(s): Skin to skin;Hand expression  Type of Nipple: Flat Intervention(s): Shells;Double electric pump;Hand pump  Comfort (Breast/Nipple): Soft / non-tender     Hold (Positioning): Assistance needed to correctly position infant at breast and maintain latch.  LATCH Score: 6  Lactation Tools Discussed/Used     Consult Status Consult Status: Follow-up Date: 12/05/16 Follow-up type: In-patient    Jess Barters [facility] 12/05/2016, 12:43 PM

## 2016-12-05 NOTE — Clinical Social Work Maternal (Signed)
CLINICAL SOCIAL WORK MATERNAL/CHILD NOTE  Patient Details  Name: Alexis Meyer MRN: [id number] Date of Birth: 08/29/1985  Date:  12/05/2016  Clinical Social Worker Initiating Note:  Ardice Boyan, LCSW Date/ Time Initiated:  12/05/16/1100     Child's Name:  [name]   Legal Guardian:  Mother (Theona Simmerman)   Need for Interpreter:  None   Date of Referral:  12/05/16     Reason for Referral:  Other (Comment) (Bipolar and Anxiety)   Referral Source:  Central Nursery   Address:  [address] [place], [place] [address]  Phone number:  [phone]   Household Members:  Significant Other, Minor Children (MOB reports that she lives with FOB and his two daughters, ages 14 and 15.)   Natural Supports (not living in the home):  Immediate Family, Extended Family, Friends   Professional Supports: None (MOB had a psychiatrist in 2013 and is interested in resuming mental health treatment.)   Employment:     Type of Work: [employer] Employee   Education:      Financial Resources:  Medicaid, Private Insurance   Other Resources:      Cultural/Religious Considerations Which May Impact Care: None stated.  MOB's facesheet notes religion as Christian.  Strengths:  Ability to meet basic needs , Home prepared for child  ([facility])   Risk Factors/Current Problems:  Mental Health Concerns  (Bipolar and Anxiety)   Cognitive State:  Insightful , Goal Oriented , Linear Thinking  (MOB appeared very sleepy and unable to keep her eyes open at times.)   Mood/Affect:  Calm , Interested , Relaxed    CSW Assessment: CSW met with MOB in her first floor room/141 to offer support and complete assessment due to Bipolar and Anxiety noted in medical record.  MOB appeared very sleepy when CSW arrived, with baby asleep on her chest.  CSW offered to return at a later time, but MOB insisted that this was a good time to talk with her.  She was pleasant and welcoming and able  to carry on a conversation, however, CSW noticed her unable to keep her eyes open at times. MOB reports that her c-section was scheduled and went well.  She states she is "super tired," but that she and baby are doing well.  She states she is both breast and bottle feeding and stated she has already cried because breast feeding wasn't natural and immediate.  CSW assisted her in processing her thoughts and feelings related to becoming a mother and on topics such as breast feeding.  MOB was receptive.   MOB was open about her mental health hx and states she has been diagnosed with Bipolar and Anxiety, but reports that her symptoms or more depression than mania.  She states she took medication for a time, but none in pregnancy.  She cannot recall what she was prescribed, but thinks it was Risperdal and Xanax.  She reports that she didn't like how it made her feel numb and out of body.  She identifies the benefit of the medications in reducing her symptoms as well and states she is interested in returning to a psychiatrist for evaluation, medication management, as well as finding a therapist for counseling.  CSW strongly encourages this and commends MOB for her willingness to address mental health concerns.  MOB reports feeling well emotionally at this time, however, reports "stress" and "a lot of adjustment" recently due to FOB's daughters moving in with them.  She is   concerned that she and baby will "take the back burner to what FOB is dealing with with his daughters."  CSW encouraged open communication between her and FOB and suggests not to make conclusions about how things will be when things have not had time to happen yet.  CSW spoke about mindfulness/staying in the moment and MOB stated understanding.  She appeared appreciative of CSW's support. CSW provided education regarding signs and symptoms of baby blues vs PMADs and explained that she may be at greater risk of experiencing PMADs given her mental health  hx.  MOB was understanding.  CSW also explained that the postpartum time is a huge adjustment and a fragile time emotionally.  CSW provided MOB with information about support groups held at [facility] as well as encouraged MOB to self-evaluate with a New Mom Checklist from Postpartum Progress.  CSW informed MOB that more information about PMADs can be found on their website as well.  MOB thanked CSW for the information and for the encouragement.  CSW recommends going to her insurance company website to find in-network providers.  MOB reports not needing assistance with locating a provider.   MOB states awareness of SIDS precautions.  CSW encouraged MOB to get some rest here after putting baby in the bassinet.  MOB stated understanding and reports that she has all necessary supplies for infant at home.  CSW notified bedside RN of how tired MOB appears and that CSW asked her to put baby in bassinet prior to sleeping.    CSW Plan/Description:  Information/Referral to Community Resources , No Further Intervention Required/No Barriers to Discharge, Patient/Family Education     Auston Halfmann Elizabeth, LCSW 12/05/2016, 2:25 PM 

## 2016-12-05 NOTE — Progress Notes (Addendum)
Subjective: Postpartum Day 1: Cesarean Delivery Patient reports incisional pain and tolerating PO.    Objective: Vital signs in last 24 hours: Temp:  [98.1 F (36.7 C)-99 F (37.2 C)] 98.1 F (36.7 C) (07/03 0505) Pulse Rate:  [68-89] 74 (07/03 0505) Resp:  [16-21] 16 (07/03 0505) BP: (114-137)/(67-95) 130/77 (07/03 0505) SpO2:  [94 %-100 %] 100 % (07/03 0505)  Physical Exam:  General: alert and cooperative Lochia: appropriate Uterine Fundus: firm Incision: bandage clean dry and intact  DVT Evaluation: No evidence of DVT seen on physical exam.   Recent Labs  12/05/16 0509  HGB 9.9*  HCT 30.3*    Assessment/Plan: Status post Cesarean section. Doing well postoperatively.  Continue current care +Encourage ambulation in halls TID +Pt may shower - Remove outer bandage in shower  +H/o Anxiety and bipolar disorder pt not currently on medication.  Pt seems well controlled without medication. - social work consult ordered. +Lactation consultation  CCOB covering 12/06/2016.  Alexis Meyer J. 12/05/2016, 7:48 AM

## 2016-12-05 NOTE — Progress Notes (Signed)
Rn called Irene Shipper CNM regarding patient's bp of 154/79 at [place].   Patient does not have a history of high blood pressure.  Patient 's systolic blood pressure has been in 120 to 130s. Diastolic is WNL.   At 2130, Rn 's PIH assessment was WNL. Rn repeated patient's  BP which was 134/73.   Rn used large cuff which was appropriate for patient   Midwife stated to call if blood pressure increases again.

## 2016-12-05 NOTE — Plan of Care (Signed)
Problem: Education: Goal: Knowledge of condition will improve Discussed the importance of ambulating in hallway three times today.   Problem: Nutritional: Goal: Mothers verbalization of comfort with breastfeeding process will improve Discussed the importance of attempting breast feeding with every feeding, hand expression and pumping regularly to assist with milk supply since baby is late preterm.

## 2016-12-06 NOTE — Lactation Note (Signed)
This note was copied from a baby's chart. Lactation Consultation Note  Patient Name: Alexis Meyer SUPJS'R Date: 12/06/2016 Reason for consult: Follow-up assessment;Late preterm infant Infant is 55 hours old & seen by [facility] for follow-up assessment. Baby was asleep with mom when [place] entered. Mom reports that she has been BF q 3hrs followed by supplementing with EBM or formula followed by pumping. Mom reports that baby was latching better in the beginning but has not been lately and she thinks it is due to her flat nipples and him getting bottles. Mom states he only latches for ~5 mins and then either falls asleep or gets frustrated. Mom reports she was getting EBM from pumping but the past few times, she has not seen even drops. Asked mom if she has been using the preemie/ initiate phase on the pump and mom stated she has not been; encouraged mom to do so (showed mom how to on the pump) followed by ~5 mins of hand expression. Mom stated she has not seen any milk when she has hand expressed; reviewed technique with mom and she was unable to get a drop. When LC demonstrated on mom, a drop was seen. Discussed importance of hand expression & pumping even if she is not getting any or much milk. Also discussed importance of skin-to-skin. Discussed how LC would like to see a latch to see if a nipple shield is needed and mom stated next feeding would be ~3:30pm so this LC is leaving a note for another LC to f/u with mom at 3:30pm feeding if possible. Encouraged mom to continue her routine of attempting to BF q 3hrs followed by supplementing and then pumping for 15-20 mins & hand expression. Mom has [employer] so will need to get pump on day of discharge. Mom reports no questions at this time. Encouraged mom to ask if she has any later.  Maternal Data    Feeding Feeding Type: Bottle Fed - Formula Length of feed: 5 min  LATCH Score/Interventions                      Lactation Tools  Discussed/Used     Consult Status Consult Status: Follow-up Date: 12/07/16 Follow-up type: In-patient    Yvonna Alanis 12/06/2016, 1:55 PM

## 2016-12-06 NOTE — Discharge Instructions (Signed)

## 2016-12-06 NOTE — Progress Notes (Signed)
Alexis Meyer [id number]  Subjective: Postpartum Day 2: scheduled C/S due to previous myomectomy Patient up ad lib, reports no syncope or dizziness. Feeding:  breast   Objective: Temp:  [98 F (36.7 C)-98.7 F (37.1 C)] 98.4 F (36.9 C) (07/04 0516) Pulse Rate:  [78-93] 86 (07/04 0516) Resp:  [16-18] 18 (07/04 0516) BP: (122-154)/(57-79) 137/75 (07/04 0516) SpO2:  [97 %-99 %] 97 % (07/03 1835)  CBC Latest Ref Rng & Units 12/05/2016 12/01/2016 05/19/2015  WBC 4.0 - 10.5 K/uL 16.2(H) 15.3(H) 9.4  Hemoglobin 12.0 - 15.0 g/dL 9.9(L) 11.4(L) 12.8  Hematocrit 36.0 - 46.0 % 30.3(L) 35.0(L) 39.6  Platelets 150 - 400 K/uL 192 265 296     Physical Exam:  General: alert and cooperative Lochia: appropriate Uterine Fundus: firm Abdomen:  + bowel sounds, positive Incision: Honeycomb dressing CDI DVT Evaluation: No evidence of DVT seen on physical exam. JP drain:     Assessment/Plan: Status post cesarean delivery, day 2. Stable Continue current care. Plan for discharge tomorrow and Breastfeeding    Larey Days MSN, CNM 12/06/2016, 9:24 AM

## 2016-12-07 MED ORDER — IBUPROFEN 600 MG PO TABS: 600.0000 mg | ORAL_TABLET | Freq: Four times a day (QID) | ORAL | 1 refills | Status: DC

## 2016-12-07 MED ORDER — OXYCODONE HCL 5 MG PO TABS: 5.0000 mg | ORAL_TABLET | ORAL | 0 refills | Status: DC | PRN

## 2016-12-07 MED FILL — TARON-C DHA CAPSULE: 53.5-38-1 | 30 days supply | Qty: 30 | Fill #4

## 2016-12-07 MED FILL — oxyCODONE HCL 5 MG TABS: 5 | 3 days supply | Qty: 20 | Fill #0

## 2016-12-07 NOTE — Lactation Note (Signed)
This note was copied from a baby's chart. Lactation Consultation Note  Patient Name: Alexis Meyer HYQMV'[id number] Date: 12/07/2016 Reason for consult: Follow-up assessment;Late preterm infant Mom is pump/bottle feeding this LPT baby. Milk coming to volume, breast soft with pumping but Mom c/o of sore nipples (rubbing) with the pump. Flanges changed to size 30 and Mom reports less discomfort. With pumping 30 flanges appears to fit well. Advised to apply coconut oil to nipple/aerola with pumping. Stressed importance of pumping every 3 hours for 15 minutes, at least 8 times per day to encourage milk production, prevent engorgement and protect milk supply. Pump 1 time at night. Engorgement care reviewed if needed. Breast milk storage guidelines discussed, refer to PP booklet, page 36. Mom does not want to work on latch, happy pump/bottle feeding. Encouraged to call for questions/concerns. Encouraged support group. Mom getting pump thru [facility]   Maternal Data    Feeding    LATCH Score/Interventions                      Lactation Tools Discussed/Used Tools: Pump Breast pump type: Double-Electric Breast Pump   Consult Status Consult Status: Complete Date: 12/07/16 Follow-up type: In-patient    Katrine Coho 12/07/2016, 10:04 AM

## 2016-12-07 NOTE — Discharge Summary (Signed)
OB Discharge Summary     Patient Name: Alexis Meyer DOB: 1986/06/04 MRN: [id number]  Date of admission: 12/04/2016 Delivering MD: Christophe Louis   Date of discharge: 12/07/2016  Admitting diagnosis: O34 29 Pregnancy w history of Myomectomy Intrauterine pregnancy: [redacted]w[redacted]d     Secondary diagnosis:  Active Problems:   S/P cesarean section  Additional problems: Bipolar disorder     Discharge diagnosis: Early term pregnancy delivered                                                                                                Post partum procedures:None  Augmentation: n/a  Complications: None  Hospital course:  Sceduled C/S   31 y.o. yo [phone] at [redacted]w[redacted]d was admitted to the hospital 12/04/2016 for scheduled cesarean section with the following indication:Prior Uterine Surgery.  Membrane Rupture Time/Date: 8:02 AM ,12/04/2016   Patient delivered a Viable infant.12/04/2016  Details of operation can be found in separate operative note.  Pateint had an uncomplicated postpartum course.  She is ambulating, tolerating a regular diet, passing flatus, and urinating well. Patient is discharged home in stable condition on  12/07/16         Physical exam  Vitals:   12/05/16 2140 12/06/16 0516 12/06/16 1754 12/07/16 0529  BP: 134/73 137/75 139/83 133/73  Pulse: 78 86 91 79  Resp:  18 18 16   Temp:  98.4 F (36.9 C) 97.7 F (36.5 C) 98.4 F (36.9 C)  TempSrc:  Oral Oral Oral  SpO2:    95%  Weight:      Height:       General: alert, cooperative and no distress Lochia: appropriate Uterine Fundus: firm Incision: Dressing is clean, dry, and intact DVT Evaluation: No evidence of DVT seen on physical exam. Calf/Ankle edema is present Labs: Lab Results  Component Value Date   WBC 16.2 (H) 12/05/2016   HGB 9.9 (L) 12/05/2016   HCT 30.3 (L) 12/05/2016   MCV 94.4 12/05/2016   PLT 192 12/05/2016   CMP Latest Ref Rng & Units 03/09/2016  Glucose 65 - 99 mg/dL -  BUN 6 - 20 mg/dL -  Creatinine  0.44 - 1.00 mg/dL -  Sodium 135 - 145 mmol/L -  Potassium 3.5 - 5.1 mmol/L -  Chloride 101 - 111 mmol/L -  CO2 22 - 32 mmol/L -  Calcium 8.9 - 10.3 mg/dL -  Total Protein 6.1 - 8.1 g/dL 6.7  Total Bilirubin 0.2 - 1.2 mg/dL 0.4  Alkaline Phos 33 - 115 U/L 50  AST 10 - 30 U/L 11  ALT 6 - 29 U/L 7    Discharge instruction: per After Visit Summary and "Baby and Me Booklet".  After visit meds:  Allergies as of 12/07/2016      Reactions   Nickel Hives      Medication List    TAKE these medications   ibuprofen 600 MG tablet Commonly known as:  ADVIL,MOTRIN Take 1 tablet (600 mg total) by mouth every 6 (six) hours.   multivitamin-prenatal 27-0.8 MG Tabs tablet Take 1 tablet by mouth daily at 12  noon.   oxyCODONE 5 MG immediate release tablet Commonly known as:  Oxy IR/ROXICODONE Take 1 tablet (5 mg total) by mouth every 4 (four) hours as needed (pain scale 4-7).   TARON-C DHA 53.5-38-1 MG Caps Take 1 capsule by mouth daily.   terbinafine 250 MG tablet Commonly known as:  LAMISIL Take 1 tablet (250 mg total) by mouth daily.       Diet: routine diet  Activity: Advance as tolerated. Pelvic rest for 6 weeks.   Outpatient follow up:2 weeks Follow up Appt:No future appointments. Follow up Visit:No Follow-up on file.  Postpartum contraception: Deferred  Newborn Data: Live born female  Birth Weight: 6 lb 12.8 oz (3085 g) APGAR: 8, 9  Baby Feeding: Breast Disposition:home with mother   12/07/2016 Thurnell Lose, MD

## 2017-01-01 DIAGNOSIS — K5901 Slow transit constipation: Secondary | ICD-10-CM | POA: Diagnosis not present

## 2017-01-01 DIAGNOSIS — Z6841 Body Mass Index (BMI) 40.0 and over, adult: Secondary | ICD-10-CM | POA: Diagnosis not present

## 2017-01-08 MED FILL — TARON-C DHA CAPSULE: 53.5-38-1 | 30 days supply | Qty: 30 | Fill #5

## 2017-01-16 DIAGNOSIS — J020 Streptococcal pharyngitis: Secondary | ICD-10-CM | POA: Diagnosis not present

## 2017-01-16 MED FILL — AMOXICILLIN 875 MG TABLET: 875 | 10 days supply | Qty: 20 | Fill #0

## 2017-03-17 ENCOUNTER — Emergency Department (HOSPITAL_COMMUNITY)
Admission: EM | Admit: 2017-03-17 | Discharge: 2017-03-18 | Disposition: A | Discharge status: Home / Self Care | Payer: 59 | Attending: Emergency Medicine | Admitting: Emergency Medicine

## 2017-03-17 ENCOUNTER — Encounter (HOSPITAL_COMMUNITY): Payer: Self-pay | Admitting: Emergency Medicine

## 2017-03-17 DIAGNOSIS — H60331 Swimmer's ear, right ear: Secondary | ICD-10-CM | POA: Insufficient documentation

## 2017-03-17 DIAGNOSIS — Z79899 Other long term (current) drug therapy: Secondary | ICD-10-CM | POA: Diagnosis not present

## 2017-03-17 DIAGNOSIS — H9201 Otalgia, right ear: Secondary | ICD-10-CM | POA: Diagnosis not present

## 2017-03-17 DIAGNOSIS — H6691 Otitis media, unspecified, right ear: Secondary | ICD-10-CM | POA: Insufficient documentation

## 2017-03-17 NOTE — ED Provider Notes (Signed)
[facility] DEPT Provider Note   CSN: [id number] Arrival date & time: 03/17/17  2320     History   Chief Complaint Chief Complaint  Patient presents with  . Foreign Body in [place] Alexis Meyer is a 31 y.o. female who presents to the ED with possible foreign body in the right ear canal. Patient reports that yesterday she cleaned her ear with a Q-tip and thinks part of the cotton is still there. She is have a full feeling in the ear and pressure. She tried using a bulb syringe to suction it out without success.    HPI  Past Medical History:  Diagnosis Date  . Anxiety   . Bipolar disorder ([place])    has RX but doesn't take it  . Depression   . GERD (gastroesophageal reflux disease)   . Herpes   . Urinary tract infection     Patient Active Problem List   Diagnosis Date Noted  . S/P cesarean section 12/04/2016  . Bipolar II disorder ([place]) 10/29/2013    Past Surgical History:  Procedure Laterality Date  . CESAREAN SECTION N/A 12/04/2016   Procedure: CESAREAN SECTION;  Surgeon: Christophe Louis, MD;  Location: [place];  Service: Obstetrics;  Laterality: N/A;  EDD 12/24/16  . FRACTURE SURGERY     left wrist  . ROBOT ASSISTED MYOMECTOMY N/A 05/26/2015   Procedure: ROBOTIC ASSISTED MYOMECTOMY;  Surgeon: Governor Specking, MD;  Location: [facility];  Service: Gynecology;  Laterality: N/A;  Dr. MODY will assist   . WRIST SURGERY      OB History    Gravida Para Term Preterm AB Living   4 1   1 2 1    SAB TAB Ectopic Multiple Live Births   2     0 1       Home Medications    Prior to Admission medications   Medication Sig Start Date End Date Taking? Authorizing Provider  ibuprofen (ADVIL,MOTRIN) 600 MG tablet Take 1 tablet (600 mg total) by mouth every 6 (six) hours. 12/07/16   Thurnell Lose, MD  neomycin-polymyxin-hydrocortisone (CORTISPORIN) 3.5-10000-1 OTIC suspension Place 4 drops into the right ear 3 (three) times daily. 03/18/17   Ashley Murrain, NP    oxyCODONE (OXY IR/ROXICODONE) 5 MG immediate release tablet Take 1 tablet (5 mg total) by mouth every 4 (four) hours as needed (pain scale 4-7). 12/07/16   Thurnell Lose, MD  Prenat-FeFum-FePo-FA-Omega 3 (TARON-C DHA) 53.5-38-1 MG CAPS Take 1 capsule by mouth daily. 11/03/16   [provider]  Prenatal Vit-Fe Fumarate-FA (MULTIVITAMIN-PRENATAL) 27-0.8 MG TABS tablet Take 1 tablet by mouth daily at 12 noon.    [provider]  pseudoephedrine (SUDAFED) 30 MG tablet Take 1 tablet (30 mg total) by mouth every 6 (six) hours as needed for congestion. 03/17/17   Ashley Murrain, NP  terbinafine (LAMISIL) 250 MG tablet Take 1 tablet (250 mg total) by mouth daily. Patient not taking: Reported on 11/29/2016 03/09/16   Garrel Ridgel, DPM    Family History Family History  Problem Relation Age of Onset  . Adopted: Yes    Social History Social History  Substance Use Topics  . Smoking status: Never Smoker  . Smokeless tobacco: Never Used  . Alcohol use No     Allergies   Nickel   Review of Systems Review of Systems  HENT: Positive for ear pain (right).   All other systems reviewed and are negative.    Physical  Exam Updated Vital Signs BP (!) 147/96 (BP Location: Right Arm)   Pulse 80   Temp 98.4 F (36.9 C) (Oral)   Resp 18   SpO2 99%   Physical Exam  Constitutional: She appears well-developed and well-nourished. No distress.  HENT:  Head: Normocephalic.  Right Ear: There is swelling. No foreign bodies. Tympanic membrane is retracted (dull).  Left Ear: Tympanic membrane normal.  Nose: Nose normal.  Eyes: EOM are normal.  Neck: Neck supple.  Cardiovascular: Normal rate.   Pulmonary/Chest: Effort normal.  Musculoskeletal: Normal range of motion.  Neurological: She is alert.  Skin: Skin is warm and dry.  Psychiatric: She has a normal mood and affect. Her behavior is normal.  Nursing note and vitals reviewed.    ED Treatments / Results  Labs (all labs  ordered are listed, but only abnormal results are displayed) Labs Reviewed - No data to display  Radiology No results found.  Procedures Procedures (including critical care time)  Medications Ordered in ED Medications  pseudoephedrine (SUDAFED) tablet 30 mg (not administered)     Initial Impression / Assessment and Plan / ED Course  I have reviewed the triage vital signs and the nursing notes.  31 y.o. female with right ear pressure and sensation of foreign body stable for d/c without f/b noted to right ear. There is swelling of the canal and the TM is dull and retracted. Will treat with cortisporin otic and with sudafed. Patient to f/u with her PCP later this week for recheck. Return precautions discussed. Final Clinical Impressions(s) / ED Diagnoses   Final diagnoses:  Acute otitis media, right    New Prescriptions New Prescriptions   NEOMYCIN-POLYMYXIN-HYDROCORTISONE (CORTISPORIN) 3.5-10000-1 OTIC SUSPENSION    Place 4 drops into the right ear 3 (three) times daily.   PSEUDOEPHEDRINE (SUDAFED) 30 MG TABLET    Take 1 tablet (30 mg total) by mouth every 6 (six) hours as needed for congestion.     Debroah Baller [place], [place] 03/18/17 0011    Fredia Sorrow, MD 03/19/17 [phone]

## 2017-03-17 NOTE — ED Triage Notes (Signed)
Pt states she was cleaning her ears with a qtip when the cotton came off the end of it in her right ear  Pt is c/o right ear pain

## 2017-03-18 MED ORDER — PSEUDOEPHEDRINE HCL 30 MG PO TABS: 30.0000 mg | ORAL_TABLET | Freq: Four times a day (QID) | ORAL | 0 refills | Status: DC | PRN

## 2017-03-18 MED ORDER — NEOMYCIN-POLYMYXIN-HC 3.5-10000-1 OT SUSP: 4.0000 [drp] | Freq: Three times a day (TID) | OTIC | 0 refills | Status: DC

## 2017-03-18 MED ORDER — PSEUDOEPHEDRINE HCL 60 MG PO TABS
30.0000 mg | ORAL_TABLET | Freq: Once | ORAL | Status: AC
  Administered 2017-03-18: 30 mg via ORAL
  Filled 2017-03-18: qty 1

## 2017-03-18 NOTE — Discharge Instructions (Signed)
I am prescribing medication to take by mouth and drops for your ear since there is some swelling of the ear canal. Follow up with your doctor next week for recheck. Return here for worsening symptoms or any problems.

## 2017-03-28 MED FILL — TARON-C DHA CAPSULE: 53.5-38-1 | 30 days supply | Qty: 30 | Fill #6

## 2017-07-27 ENCOUNTER — Other Ambulatory Visit (HOSPITAL_COMMUNITY)
Admission: RE | Admit: 2017-07-27 | Discharge: 2017-07-27 | Disposition: A | Discharge status: Home / Self Care | Payer: 59 | Source: Ambulatory Visit | Attending: Obstetrics and Gynecology | Admitting: Obstetrics and Gynecology

## 2017-07-27 ENCOUNTER — Other Ambulatory Visit: Payer: Self-pay | Admitting: Obstetrics and Gynecology

## 2017-07-27 DIAGNOSIS — Z01419 Encounter for gynecological examination (general) (routine) without abnormal findings: Secondary | ICD-10-CM | POA: Diagnosis not present

## 2017-07-27 DIAGNOSIS — N921 Excessive and frequent menstruation with irregular cycle: Secondary | ICD-10-CM | POA: Diagnosis not present

## 2017-07-27 DIAGNOSIS — Z124 Encounter for screening for malignant neoplasm of cervix: Secondary | ICD-10-CM | POA: Insufficient documentation

## 2017-07-31 LAB — CYTOLOGY - PAP
Diagnosis: NEGATIVE
HPV: NOT DETECTED

## 2017-08-20 DIAGNOSIS — N921 Excessive and frequent menstruation with irregular cycle: Secondary | ICD-10-CM | POA: Diagnosis not present

## 2017-08-20 DIAGNOSIS — D251 Intramural leiomyoma of uterus: Secondary | ICD-10-CM | POA: Diagnosis not present

## 2017-10-12 ENCOUNTER — Ambulatory Visit (HOSPITAL_COMMUNITY)
Admission: EM | Admit: 2017-10-12 | Discharge: 2017-10-12 | Disposition: A | Discharge status: Home / Self Care | Payer: 59 | Attending: Family Medicine | Admitting: Family Medicine

## 2017-10-12 ENCOUNTER — Encounter (HOSPITAL_COMMUNITY): Payer: Self-pay | Admitting: Family Medicine

## 2017-10-12 DIAGNOSIS — B354 Tinea corporis: Secondary | ICD-10-CM | POA: Diagnosis not present

## 2017-10-12 MED ORDER — CLOTRIMAZOLE 1 % EX CREA: TOPICAL_CREAM | CUTANEOUS | 0 refills | Status: DC

## 2017-10-12 NOTE — Discharge Instructions (Signed)
Start clotrimazole on affected area as directed for the next 2 weeks. Monitor for any spreading, follow up here or with PCP for further evaluation needed.

## 2017-10-12 NOTE — ED Triage Notes (Signed)
Pt here for possible ring worm to bilateral arms with itching.

## 2017-10-12 NOTE — ED Provider Notes (Signed)
[facility]    CSN: [id number] Arrival date & time: 10/12/17  1627     History   Chief Complaint Chief Complaint  Patient presents with  . Tinea    HPI Alexis Meyer is a 32 y.o. female.   32 year old female comes in with 2-3 day history of itching and rash to bilateral arms. States transported a patient with ringworms recently, and started noticing rash on her. Denies pain. No obvious rash on face/neck/ hair. No spreading erythema, increased warmth, fever. Currently breastfeeding.      Past Medical History:  Diagnosis Date  . Anxiety   . Bipolar disorder ([place])    has RX but doesn't take it  . Depression   . GERD (gastroesophageal reflux disease)   . Herpes   . Urinary tract infection     Patient Active Problem List   Diagnosis Date Noted  . S/P cesarean section 12/04/2016  . Bipolar II disorder ([place]) 10/29/2013    Past Surgical History:  Procedure Laterality Date  . CESAREAN SECTION N/A 12/04/2016   Procedure: CESAREAN SECTION;  Surgeon: Christophe Louis, MD;  Location: [facility];  Service: Obstetrics;  Laterality: N/A;  EDD 12/24/16  . FRACTURE SURGERY     left wrist  . ROBOT ASSISTED MYOMECTOMY N/A 05/26/2015   Procedure: ROBOTIC ASSISTED MYOMECTOMY;  Surgeon: Governor Specking, MD;  Location: [facility];  Service: Gynecology;  Laterality: N/A;  Dr. MODY will assist   . WRIST SURGERY      OB History    Gravida  4   Para  1   Term      Preterm  1   AB  2   Living  1     SAB  2   TAB      Ectopic      Multiple  0   Live Births  1            Home Medications    Prior to Admission medications   Medication Sig Start Date End Date Taking? Authorizing Provider  clotrimazole (LOTRIMIN) 1 % cream Apply to affected area 2 times daily 10/12/17   Tasia Catchings, Carissa Musick V, PA-C  ibuprofen (ADVIL,MOTRIN) 600 MG tablet Take 1 tablet (600 mg total) by mouth every 6 (six) hours. 12/07/16   Thurnell Lose, MD  neomycin-polymyxin-hydrocortisone  (CORTISPORIN) 3.5-10000-1 OTIC suspension Place 4 drops into the right ear 3 (three) times daily. 03/18/17   Ashley Murrain, NP  Prenat-FeFum-FePo-FA-Omega 3 (TARON-C DHA) 53.5-38-1 MG CAPS Take 1 capsule by mouth daily. 11/03/16   [provider]  Prenatal Vit-Fe Fumarate-FA (MULTIVITAMIN-PRENATAL) 27-0.8 MG TABS tablet Take 1 tablet by mouth daily at 12 noon.    [provider]  pseudoephedrine (SUDAFED) 30 MG tablet Take 1 tablet (30 mg total) by mouth every 6 (six) hours as needed for congestion. 03/17/17   Ashley Murrain, NP  terbinafine (LAMISIL) 250 MG tablet Take 1 tablet (250 mg total) by mouth daily. Patient not taking: Reported on 11/29/2016 03/09/16   Garrel Ridgel, DPM    Family History Family History  Adopted: Yes    Social History Social History   Tobacco Use  . Smoking status: Never Smoker  . Smokeless tobacco: Never Used  Substance Use Topics  . Alcohol use: No  . Drug use: No     Allergies   Nickel   Review of Systems Review of Systems  Reason unable to perform ROS: See HPI as above.  Physical Exam Triage Vital Signs ED Triage Vitals  Enc Vitals Group     BP 10/12/17 1646 112/79     Pulse Rate 10/12/17 1646 89     Resp 10/12/17 1646 18     Temp 10/12/17 1646 98.2 F (36.8 C)     Temp src --      SpO2 10/12/17 1646 100 %     Weight --      Height --      Head Circumference --      Peak Flow --      Pain Score 10/12/17 1643 0     Pain Loc --      Pain Edu? --      Excl. in [place]? --    No data found.  Updated Vital Signs BP 112/79   Pulse 89   Temp 98.2 F (36.8 C)   Resp 18   SpO2 100%   Physical Exam  Constitutional: She is oriented to person, place, and time. She appears well-developed and well-nourished. No distress.  HENT:  Head: Normocephalic and atraumatic.  Eyes: Pupils are equal, round, and reactive to light. Conjunctivae are normal.  Neurological: She is alert and oriented to person, place, and time.  Skin:  Skin is dry.  See picture below. No pain on palpation. No increased warmth.          UC Treatments / Results  Labs (all labs ordered are listed, but only abnormal results are displayed) Labs Reviewed - No data to display  EKG None  Radiology No results found.  Procedures Procedures (including critical care time)  Medications Ordered in UC Medications - No data to display  Initial Impression / Assessment and Plan / UC Course  I have reviewed the triage vital signs and the nursing notes.  Pertinent labs & imaging results that were available during my care of the patient were reviewed by me and considered in my medical decision making (see chart for details).    Lotrimin as directed. Monitor for spreading to the hair. Return precautions given.   Final Clinical Impressions(s) / UC Diagnoses   Final diagnoses:  Tinea corporis   ED Prescriptions    Medication Sig Dispense Auth. Provider   clotrimazole (LOTRIMIN) 1 % cream Apply to affected area 2 times daily 15 g Tobin Chad, [place] 10/12/17 1705

## 2017-10-26 DIAGNOSIS — D251 Intramural leiomyoma of uterus: Secondary | ICD-10-CM | POA: Diagnosis not present

## 2017-10-26 DIAGNOSIS — N921 Excessive and frequent menstruation with irregular cycle: Secondary | ICD-10-CM | POA: Diagnosis not present

## 2017-11-14 ENCOUNTER — Ambulatory Visit (HOSPITAL_COMMUNITY): Admission: RE | Admit: 2017-11-14 | Payer: 59 | Source: Ambulatory Visit | Admitting: Obstetrics and Gynecology

## 2017-11-14 ENCOUNTER — Encounter (HOSPITAL_COMMUNITY): Admission: RE | Payer: Self-pay | Source: Ambulatory Visit

## 2017-11-14 SURGERY — XI ROBOTIC ASSISTED LAPAROSCOPIC HYSTERECTOMY AND SALPINGECTOMY
Anesthesia: Choice | Laterality: Bilateral

## 2017-11-27 DIAGNOSIS — B351 Tinea unguium: Secondary | ICD-10-CM | POA: Diagnosis not present

## 2017-11-27 DIAGNOSIS — Z713 Dietary counseling and surveillance: Secondary | ICD-10-CM | POA: Diagnosis not present

## 2017-11-27 DIAGNOSIS — R350 Frequency of micturition: Secondary | ICD-10-CM | POA: Diagnosis not present

## 2017-11-27 DIAGNOSIS — R5383 Other fatigue: Secondary | ICD-10-CM | POA: Diagnosis not present

## 2017-11-27 DIAGNOSIS — Z0000 Encounter for general adult medical examination without abnormal findings: Secondary | ICD-10-CM | POA: Diagnosis not present

## 2017-11-27 DIAGNOSIS — Z13220 Encounter for screening for lipoid disorders: Secondary | ICD-10-CM | POA: Diagnosis not present

## 2017-11-27 MED FILL — TERBINAFINE HCL 250 MG TAB: 250 | 90 days supply | Qty: 90 | Fill #0

## 2017-12-15 ENCOUNTER — Ambulatory Visit (HOSPITAL_COMMUNITY)
Admission: RE | Admit: 2017-12-15 | Discharge: 2017-12-15 | Disposition: A | Discharge status: Home / Self Care | Payer: 59 | Attending: Psychiatry | Admitting: Psychiatry

## 2017-12-15 DIAGNOSIS — F3132 Bipolar disorder, current episode depressed, moderate: Secondary | ICD-10-CM | POA: Diagnosis not present

## 2017-12-15 NOTE — H&P (Signed)
Behavioral Health Medical Screening Exam  Alexis Meyer is an 33 y.o. female patient presents to [facility] as a walk in with complaints of depression and anxiety after finding out her boyfriend is still cheating on her.  Patient denies suicidal/self-harm/homicidal ideation, psychosis, and paranoia.  Patient has had depression on and off for years and has never sought out help; patient is interested in outpatient services  Total Time spent with patient: 30 minutes  Psychiatric Specialty Exam: Physical Exam  Vitals reviewed. Constitutional: She is oriented to person, place, and time. She appears well-nourished.  Neck: Normal range of motion.  Respiratory: Effort normal.  Musculoskeletal: Normal range of motion.  Neurological: She is alert and oriented to person, place, and time.  Skin: Skin is warm and dry.  Psychiatric: Her speech is normal and behavior is normal. Judgment and thought content normal. Her mood appears anxious (Stable). Cognition and memory are normal. She exhibits a depressed mood (Stable).    Review of Systems  Psychiatric/Behavioral: Positive for depression (Stable). Negative for hallucinations, memory loss, substance abuse and suicidal ideas (Denies). The patient is nervous/anxious (Stable). The patient does not have insomnia.   All other systems reviewed and are negative.   Blood pressure (!) 154/85, pulse 98, temperature 98.6 F (37 C), resp. rate 18, SpO2 98 %, unknown if currently breastfeeding.There is no height or weight on file to calculate BMI.  General Appearance: Casual  Eye Contact:  Good  Speech:  Clear and Coherent and Normal Rate  Volume:  Normal  Mood:  Depressed  Affect:  Depressed and Tearful  Thought Process:  Coherent and Goal Directed  Orientation:  Full (Time, Place, and Person)  Thought Content:  Logical  Suicidal Thoughts:  No  Homicidal Thoughts:  No  Memory:  Immediate;   Good Recent;   Good Remote;   Good  Judgement:  Intact   Insight:  Present  Psychomotor Activity:  Normal  Concentration: Concentration: Good and Attention Span: Good  Recall:  Good  Fund of Knowledge:Good  Language: Good  Akathisia:  No  Handed:  Right  AIMS (if indicated):     Assets:  Communication Skills Desire for Improvement Housing Physical Health Social Support Transportation  Sleep:       Musculoskeletal: Strength & Muscle Tone: within normal limits Gait & Station: normal Patient leans: N/A  Blood pressure (!) 154/85, pulse 98, temperature 98.6 F (37 C), resp. rate 18, SpO2 98 %, unknown if currently breastfeeding.  Recommendations: Referral to IOP  Disposition: No evidence of imminent risk to self or others at present.   Patient does not meet criteria for psychiatric inpatient admission. Supportive therapy provided about ongoing stressors. Refer to IOP. Discussed crisis plan, support from social network, calling 911, coming to the Emergency Department, and calling Suicide Hotline.  Based on my evaluation the patient does not appear to have an emergency medical condition.  Joren Rehm, NP 12/15/2017, 8:58 AM
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
## 2017-12-31 DIAGNOSIS — R5383 Other fatigue: Secondary | ICD-10-CM | POA: Diagnosis not present

## 2017-12-31 DIAGNOSIS — B351 Tinea unguium: Secondary | ICD-10-CM | POA: Diagnosis not present

## 2017-12-31 DIAGNOSIS — R350 Frequency of micturition: Secondary | ICD-10-CM | POA: Diagnosis not present

## 2017-12-31 DIAGNOSIS — Z0000 Encounter for general adult medical examination without abnormal findings: Secondary | ICD-10-CM | POA: Diagnosis not present

## 2018-01-02 DIAGNOSIS — D251 Intramural leiomyoma of uterus: Secondary | ICD-10-CM | POA: Diagnosis not present

## 2018-01-02 DIAGNOSIS — N898 Other specified noninflammatory disorders of vagina: Secondary | ICD-10-CM | POA: Diagnosis not present

## 2018-01-02 DIAGNOSIS — N921 Excessive and frequent menstruation with irregular cycle: Secondary | ICD-10-CM | POA: Diagnosis not present

## 2018-01-02 MED FILL — metroNIDAZOLE 500 MG TABS: 500 | 7 days supply | Qty: 14 | Fill #0

## 2018-01-08 ENCOUNTER — Other Ambulatory Visit: Payer: Self-pay | Admitting: Obstetrics and Gynecology

## 2018-01-09 NOTE — Patient Instructions (Addendum)
Alexis Meyer  01/09/2018   Your procedure is scheduled on: 01-16-18   Report to [facility] Main  Entrance    Report to admitting at 6:30AM    Call this number if you have problems the morning of surgery [phone]     Remember: Do not eat food or drink liquids :After Midnight.     Take these medicines the morning of surgery with A SIP OF WATER: NONE                                 You may not have any metal on your body including hair pins and              piercings  Do not wear jewelry, make-up, lotions, powders or perfumes, deodorant             Do not wear nail polish.  Do not shave  48 hours prior to surgery.          Do not bring valuables to the hospital. [place].  Contacts, dentures or bridgework may not be worn into surgery.  Leave suitcase in the car. After surgery it may be brought to your room.                   Please read over the following fact sheets you were given: _____________________________________________________________________            [facility] - Preparing for Surgery Before surgery, you can play an important role.  Because skin is not sterile, your skin needs to be as free of germs as possible.  You can reduce the number of germs on your skin by washing with CHG (chlorahexidine gluconate) soap before surgery.  CHG is an antiseptic cleaner which kills germs and bonds with the skin to continue killing germs even after washing. Please DO NOT use if you have an allergy to CHG or antibacterial soaps.  If your skin becomes reddened/irritated stop using the CHG and inform your nurse when you arrive at Short Stay. Do not shave (including legs and underarms) for at least 48 hours prior to the first CHG shower.  You may shave your face/neck. Please follow these instructions carefully:  1.  Shower with CHG Soap the night before surgery and the  morning of Surgery.  2.  If you  choose to wash your hair, wash your hair first as usual with your  normal  shampoo.  3.  After you shampoo, rinse your hair and body thoroughly to remove the  shampoo.                           4.  Use CHG as you would any other liquid soap.  You can apply chg directly  to the skin and wash                       Gently with a scrungie or clean washcloth.  5.  Apply the CHG Soap to your body ONLY FROM THE NECK DOWN.   Do not use on face/ open  Wound or open sores. Avoid contact with eyes, ears mouth and genitals (private parts).                       Wash face,  Genitals (private parts) with your normal soap.             6.  Wash thoroughly, paying special attention to the area where your surgery  will be performed.  7.  Thoroughly rinse your body with warm water from the neck down.  8.  DO NOT shower/wash with your normal soap after using and rinsing off  the CHG Soap.                9.  Pat yourself dry with a clean towel.            10.  Wear clean pajamas.            11.  Place clean sheets on your bed the night of your first shower and do not  sleep with pets. Day of Surgery : Do not apply any lotions/deodorants the morning of surgery.  Please wear clean clothes to the hospital/surgery center.  FAILURE TO FOLLOW THESE INSTRUCTIONS MAY RESULT IN THE CANCELLATION OF YOUR SURGERY PATIENT SIGNATURE_________________________________  NURSE SIGNATURE__________________________________  ________________________________________________________________________   Adam Phenix  An incentive spirometer is a tool that can help keep your lungs clear and active. This tool measures how well you are filling your lungs with each breath. Taking long deep breaths may help reverse or decrease the chance of developing breathing (pulmonary) problems (especially infection) following:  A long period of time when you are unable to move or be active. BEFORE THE PROCEDURE   If  the spirometer includes an indicator to show your best effort, your nurse or respiratory therapist will set it to a desired goal.  If possible, sit up straight or lean slightly forward. Try not to slouch.  Hold the incentive spirometer in an upright position. INSTRUCTIONS FOR USE  1. Sit on the edge of your bed if possible, or sit up as far as you can in bed or on a chair. 2. Hold the incentive spirometer in an upright position. 3. Breathe out normally. 4. Place the mouthpiece in your mouth and seal your lips tightly around it. 5. Breathe in slowly and as deeply as possible, raising the piston or the ball toward the top of the column. 6. Hold your breath for 3-5 seconds or for as long as possible. Allow the piston or ball to fall to the bottom of the column. 7. Remove the mouthpiece from your mouth and breathe out normally. 8. Rest for a few seconds and repeat Steps 1 through 7 at least 10 times every 1-2 hours when you are awake. Take your time and take a few normal breaths between deep breaths. 9. The spirometer may include an indicator to show your best effort. Use the indicator as a goal to work toward during each repetition. 10. After each set of 10 deep breaths, practice coughing to be sure your lungs are clear. If you have an incision (the cut made at the time of surgery), support your incision when coughing by placing a pillow or rolled up towels firmly against it. Once you are able to get out of bed, walk around indoors and cough well. You may stop using the incentive spirometer when instructed by your caregiver.  RISKS AND COMPLICATIONS  Take your time so you do not get  dizzy or light-headed.  If you are in pain, you may need to take or ask for pain medication before doing incentive spirometry. It is harder to take a deep breath if you are having pain. AFTER USE  Rest and breathe slowly and easily.  It can be helpful to keep track of a log of your progress. Your caregiver can  provide you with a simple table to help with this. If you are using the spirometer at home, follow these instructions: [employer] IF:   You are having difficultly using the spirometer.  You have trouble using the spirometer as often as instructed.  Your pain medication is not giving enough relief while using the spirometer.  You develop fever of 100.5 F (38.1 C) or higher. SEEK IMMEDIATE MEDICAL CARE IF:   You cough up bloody sputum that had not been present before.  You develop fever of 102 F (38.9 C) or greater.  You develop worsening pain at or near the incision site. MAKE SURE YOU:   Understand these instructions.  Will watch your condition.  Will get help right away if you are not doing well or get worse. Document Released: 10/02/2006 Document Revised: 08/14/2011 Document Reviewed: 12/03/2006 [facility] Patient Information 2014 [place], [place].   ________________________________________________________________________

## 2018-01-11 ENCOUNTER — Encounter (HOSPITAL_COMMUNITY): Payer: Self-pay

## 2018-01-11 ENCOUNTER — Encounter (HOSPITAL_COMMUNITY)
Admission: RE | Admit: 2018-01-11 | Discharge: 2018-01-11 | Disposition: A | Discharge status: Home / Self Care | Payer: 59 | Source: Ambulatory Visit | Attending: Obstetrics and Gynecology | Admitting: Obstetrics and Gynecology

## 2018-01-11 ENCOUNTER — Other Ambulatory Visit: Payer: Self-pay

## 2018-01-11 DIAGNOSIS — D259 Leiomyoma of uterus, unspecified: Secondary | ICD-10-CM | POA: Insufficient documentation

## 2018-01-11 DIAGNOSIS — N920 Excessive and frequent menstruation with regular cycle: Secondary | ICD-10-CM | POA: Diagnosis not present

## 2018-01-11 DIAGNOSIS — Z01812 Encounter for preprocedural laboratory examination: Secondary | ICD-10-CM | POA: Insufficient documentation

## 2018-01-11 HISTORY — DX: Adverse effect of unspecified anesthetic, initial encounter: T41.45XA

## 2018-01-11 HISTORY — DX: Tinea corporis: B35.4

## 2018-01-11 HISTORY — DX: Other complications of anesthesia, initial encounter: T88.59XA

## 2018-01-11 LAB — CBC
HEMATOCRIT: 38.9 % (ref 36.0–46.0)
Hemoglobin: 12.2 g/dL (ref 12.0–15.0)
MCH: 27.7 pg (ref 26.0–34.0)
MCHC: 31.4 g/dL (ref 30.0–36.0)
MCV: 88.4 fL (ref 78.0–100.0)
PLATELETS: 316 10*3/uL (ref 150–400)
RBC: 4.4 MIL/uL (ref 3.87–5.11)
RDW: 15.6 % — AB (ref 11.5–15.5)
WBC: 11.2 10*3/uL — ABNORMAL HIGH (ref 4.0–10.5)

## 2018-01-11 LAB — ABO/RH: ABO/RH(D): O POS

## 2018-01-11 LAB — PREGNANCY, URINE: Preg Test, Ur: NEGATIVE

## 2018-01-15 ENCOUNTER — Other Ambulatory Visit: Payer: Self-pay | Admitting: Obstetrics and Gynecology

## 2018-01-15 NOTE — H&P (Signed)
Chief Complaint(s):   menorrhagia and uterine fibroids   HPI:  General 32 yo presents for preop history and physical in preparation for robotic assisted laparoscopic hysterectomy with Bilateral salpingectomy as definitive management ofr menorrhagia and uterine fibroids. Her ultrasound 08/20/2017 reveals a 11 cm x 6 cm x 7 cm uterus. the endometirum is 6.8 mm. she has an anterior fibroid 4.5 cm. Her ovaries appear normal.  she does not desire future fertility. Current Medication: Taking  Terbinafine HCl 250 MG Tablet 1 tablet Orally Once a day     Medication List reviewed and reconciled with the patient   Medical History:  lactose intolerant      Allergies/Intolerance:  nickel - rash - Onset Date 10/26/2017   Gyn History:  Sexual activity currently sexually active. Periods : irregular. LMP 12/27/17. Birth control condoms. Last pap smear date 07/27/17-neg. H/O STD Herpes.   OB History:  Number of pregnancies 3. miscarriages 1. abortion 1. Pregnancy # 1 miscarriage. Pregnancy # 2 abortion. Pregnancy # 3 live birth, boy, C-section.   Surgical History:  screws and plate in left wrist 0272     laparoscopic myomectomy with dr. Kerin Perna 05/2015     D&C miscarriage 03/2012     C section boy scheduled 7/18   Hospitalization:  c-section 7/18   Family History:  1 son(s) .    adopted/maternal and paternal family hx of breast cancer on both sides.  Social History: General no EXPOSURE TO PASSIVE SMOKE.  no Alcohol.  Children: 1, son.  Caffeine: yes, tea, 1 serving daily, soda, occasionally.  DIET: lacking.  EDUCATION: Associate Degree -- working on her BS.  Tobacco use cigarettes: Never smoked, Tobacco history last updated 01/02/2018.  Marital Status: single, Divorced.  no Recreational drug use.  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  no Exercise.   ROS: CONSTITUTIONAL No" options="no,yes" propid="91" itemid="193425" categoryid="10464" encounterid="[id number]"Chills No.  No" options="no,yes" propid="91" itemid="172899" categoryid="10464" encounterid="[id number]"Fatigue No. No" options="no,yes" propid="91" itemid="10467" categoryid="10464" encounterid="[id number]"Fever No. No" options="no,yes" propid="91" itemid="193426" categoryid="10464" encounterid="[id number]"Night sweats No. No" options="no,yes" propid="91" itemid="444261" categoryid="10464" encounterid="[id number]"Recent travel outside [place] No. No" options="no,yes" propid="91" itemid="193427" categoryid="10464" encounterid="[id number]"Sweats No. No" options="no,yes" propid="91" itemid="194825" categoryid="10464" encounterid="[id number]"Weight change No.  OPHTHALMOLOGY no" options="no,yes" propid="91" itemid="12520" categoryid="12516" encounterid="[id number]"Blurring of vision no. no" options="no,yes" propid="91" itemid="193469" categoryid="12516" encounterid="[id number]"Change in vision no. no" options="no,yes" propid="91" itemid="194379" categoryid="12516" encounterid="[id number]"Double vision no.  ENT no" options="no,yes" propid="91" itemid="193612" categoryid="10481" encounterid="[id number]"Dizziness no. Nose bleeds no. Sore throat no. Teeth pain no.  ALLERGY no" options="no,yes" propid="91" itemid="202589" categoryid="138152" encounterid="[id number]"Hives no.  CARDIOLOGY no" options="no,yes" propid="91" itemid="193603" categoryid="10488" encounterid="[id number]"Chest pain no. no" options="no,yes" propid="91" itemid="199089" categoryid="10488" encounterid="[id number]"High blood pressure no. no" options="no,yes" propid="91" itemid="202598" categoryid="10488" encounterid="[id number]"Irregular heart beat no. no" options="no,yes" propid="91" itemid="10491" categoryid="10488" encounterid="[id number]"Leg edema no. no" options="no,yes" propid="91" itemid="10490" categoryid="10488" encounterid="[id number]"Palpitations no.  RESPIRATORY no" options="no" propid="91" itemid="270013" categoryid="138132" encounterid="[id number]"Shortness of breath no. no"  options="no,yes" propid="91" itemid="172745" categoryid="138132" encounterid="[id number]"Cough no. no" options="no,yes" propid="91" itemid="193621" categoryid="138132" encounterid="[id number]"Wheezing no.  UROLOGY no" options="no,yes" propid="91" [phone]" categoryid="138166" encounterid="[id number]"Pain with urination no. no" options="no,yes" propid="91" itemid="193493" categoryid="138166" encounterid="[id number]"Urinary urgency no. no" options="no,yes" propid="91" itemid="193492" categoryid="138166" encounterid="[id number]"Urinary frequency no. no" options="no,yes" propid="91" itemid="138171" categoryid="138166" encounterid="[id number]"Urinary incontinence no. No" options="no,yes" propid="91" itemid="138167" categoryid="138166" encounterid="[id number]"Difficulty urinating No. No" options="no,yes" propid="91" itemid="138168" categoryid="138166" encounterid="[id number]"Blood in urine No.  GASTROENTEROLOGY no" options="no,yes" propid="91" itemid="10496" categoryid="10494" encounterid="[id number]"Abdominal pain no. no" options="no,yes" propid="91" itemid="193447" categoryid="10494" encounterid="[id number]"Appetite change no. no" options="no,yes" propid="91" itemid="193448" categoryid="10494" encounterid="[id number]"Bloating/belching no. no" options="no,yes" propid="91" itemid="10503" categoryid="10494" encounterid="[id number]"Blood in stool or on toilet paper no. no" options="no,yes" propid="91" itemid="199106" categoryid="10494" encounterid="[id number]"Change in bowel movements no. no" options="no,yes" propid="91" itemid="10501"  categoryid="10494" encounterid="[id number]"Constipation no. no" options="no,yes" propid="91" itemid="10502" categoryid="10494" encounterid="[id number]"Diarrhea no. no" options="no,yes" propid="91" itemid="199104" categoryid="10494" encounterid="[id number]"Difficulty swallowing no. no" options="no,yes" propid="91" itemid="10499" categoryid="10494" encounterid="[id number]"Nausea no.  FEMALE REPRODUCTIVE no"  options="no,yes" propid="91" itemid="453725" categoryid="10525" encounterid="[id number]"Vulvar pain no. no" options="no,yes" propid="91" itemid="453726" categoryid="10525" encounterid="[id number]"Vulvar rash no. yes" options="no, yes" propid="91" itemid="444315" categoryid="10525" encounterid="[id number]"Abnormal vaginal bleeding yes. no" options="no,yes" propid="91" itemid="186083" categoryid="10525" encounterid="[id number]"Breast pain no. no" options="no,yes" propid="91" itemid="186084" categoryid="10525" encounterid="[id number]"Nipple discharge no. no" options="no,yes" propid="91" itemid="275823" categoryid="10525" encounterid="[id number]"Pain with intercourse no. no" options="no,yes" propid="91" itemid="186082" categoryid="10525" encounterid="[id number]"Pelvic pain no. no" options="no,yes" propid="91" itemid="278230" categoryid="10525" encounterid="[id number]"Unusual vaginal discharge no. no" options="no,yes" propid="91" itemid="278942" categoryid="10525" encounterid="[id number]"Vaginal itching no.  MUSCULOSKELETAL no" options="no,yes" propid="91" itemid="193461" categoryid="10514" encounterid="[id number]"Muscle aches no.  NEUROLOGY no" options="no,yes" propid="91" itemid="12513" categoryid="12512" encounterid="[id number]"Headache no. no" options="no,yes" propid="91" itemid="12514" categoryid="12512" encounterid="[id number]"Tingling/numbness no. no" options="no,yes" propid="91" itemid="193468" categoryid="12512" encounterid="[id number]"Weakness no.  PSYCHOLOGY no" options="" propid="91" itemid="275919" categoryid="10520" encounterid="[id number]"Depression no. no" options="no,yes" propid="91" itemid="172748" categoryid="10520" encounterid="[id number]"Anxiety no. no" options="no,yes" propid="91" itemid="199158" categoryid="10520" encounterid="[id number]"Nervousness no. no" options="no,yes" propid="91" itemid="12502" categoryid="10520" encounterid="[id number]"Sleep disturbances no. no " options="no,yes" propid="91" itemid="72718"  categoryid="10520" encounterid="[id number]"Suicidal ideation no .  ENDOCRINOLOGY no" options="no,yes" propid="91" itemid="194628" categoryid="12508" encounterid="[id number]"Excessive thirst no. no" options="no,yes" propid="91" itemid="196285" categoryid="12508" encounterid="[id number]"Excessive urination no. no" options="no, yes" propid="91" itemid="444314" categoryid="12508" encounterid="[id number]"Hair loss no. no" options="" propid="91" itemid="447284" categoryid="12508" encounterid="[id number]"Heat or cold intolerance no.  HEMATOLOGY/LYMPH no" options="no,yes" propid="91" itemid="199152" categoryid="138157" encounterid="[id number]"Abnormal bleeding no. no" options="no,yes" propid="91" itemid="170653" categoryid="138157" encounterid="10606846"Easy bruising no. no" options="no,yes" propid="91" itemid="138158" categoryid="138157" encounterid="10606846"Swollen glands no.  DERMATOLOGY no" options="no,yes" propid="91" itemid="199126" categoryid="12503" encounterid="[id number]"New/changing skin lesion no. no" options="no,yes" propid="91" itemid="12504" categoryid="12503" encounterid="10606846"Rash no. no" options="" propid="91" itemid="444313" categoryid="12503" encounterid="[id number]"Sores no.   Negative except as stated in HPI Negative except as stated in HPI Negative except as stated in HPI Negative except as stated in HPI.  Objective: Vitals: Wt 273, Wt change -3.2 lb, Ht 65.75, BMI 44.39, Pulse sitting 82, BP sitting 130/82  Past Results: Examination:  General Examination alert, oriented, NAD " categoryPropId="10089" examid="193638"CONSTITUTIONAL: alert, oriented, NAD .  moist, warm" categoryPropId="10109" examid="193638"SKIN: moist, warm.  Conjunctiva clear" categoryPropId="21468" examid="193638"EYES: Conjunctiva clear.  clear to auscultation bilaterally, clear to auscultation bilaterally" categoryPropId="87" examid="193638"LUNGS: clear to auscultation bilaterally, clear to auscultation bilaterally.  regular  rate and rhythm" categoryPropId="86" examid="193638"HEART: regular rate and rhythm.  soft, non-tender/non-distended, bowel sounds present " categoryPropId="88" examid="193638"ABDOMEN: soft, non-tender/non-distended, bowel sounds present .  normal external genitalia, labia - unremarkable, vagina - pink moist mucosa, no lesions or abnormal discharge, cervix - no discharge or lesions or CMT, adnexa - no masses or tenderness, uterus - nontender and normal size on palpation " categoryPropId="13414" examid="193638"FEMALE GENITOURINARY: normal external genitalia, labia - unremarkable, vagina - pink moist mucosa, no lesions or abnormal discharge, cervix - no discharge or lesions or CMT, adnexa - no masses or tenderness, uterus - nontender and normal size on palpation .  no edema present" categoryPropId="89" examid="193638"EXTREMITIES: no edema present.  affect normal, good eye contact" categoryPropId="16316" examid="193638"PSYCH: affect normal, good eye contact.  Physical Examination:    Assessment: Assessment:  Menorrhagia with irregular cycle - N92.1 (Primary)     Vaginal discharge - N89.8     Fibroids, intramural - D25.1     Plan: Treatment: Menorrhagia with irregular cycle Notes: r/b/a of robotic assisted laparoscopic hysterectomy with bilateral salpingectomy discussed with the patient. including but not limited to infection/ bleeding / damage to bowel bladder ureters as well as conversion to laparotomy with the need for further surgery. pt voiced understanding and would like to proceed with robotic assisted laparoscopic hysterectomy with bilateral salpingectomy.. r/o trasnfusion discussed. HIV/ HEP B and C. Vaginal  discharge Lab:[facility]  Fibroids, intramural Notes: r/b/a of robotic assisted laparoscopic hysterectomy with bilateral salpingectomy discussed with the patient. including but not limited to infection/ bleeding / damage to bowel bladder ureters as well as conversion to laparotomy with  the need for further surgery. pt voiced understanding and would like to proceed with robotic assisted laparoscopic hysterectomy with bilateral salpingectomy.. r/o trasnfusion discussed. HIV/ HEP B and C.

## 2018-01-15 NOTE — H&P (Deleted)
  The note originally documented on this encounter has been moved the the encounter in which it belongs.  

## 2018-01-15 NOTE — Anesthesia Preprocedure Evaluation (Addendum)
Anesthesia Evaluation  Patient identified by MRN, date of birth, ID band Patient awake    Reviewed: Allergy & Precautions, NPO status , Patient's Chart, lab work & pertinent test results  History of Anesthesia Complications Negative for: history of anesthetic complications  Airway Mallampati: I  TM Distance: >3 FB Neck ROM: Full    Dental  (+) Teeth Intact, Dental Advisory Given   Pulmonary neg pulmonary ROS,    breath sounds clear to auscultation       Cardiovascular negative cardio ROS   Rhythm:Regular Rate:Normal     Neuro/Psych Bipolar Disorder negative neurological ROS     GI/Hepatic Neg liver ROS, GERD  ,  Endo/Other  negative endocrine ROS  Renal/GU negative Renal ROS  negative genitourinary   Musculoskeletal negative musculoskeletal ROS (+)   Abdominal   Peds  Hematology negative hematology ROS (+)   Anesthesia Other Findings   Reproductive/Obstetrics Uterine fibroids                            Anesthesia Physical Anesthesia Plan  ASA: II  Anesthesia Plan: General   Post-op Pain Management:    Induction: Intravenous  PONV Risk Score and Plan: 3 and Midazolam, Dexamethasone and Ondansetron  Airway Management Planned: Oral ETT  Additional Equipment:   Intra-op Plan:   Post-operative Plan: Extubation in OR  Informed Consent: I have reviewed the patients History and Physical, chart, labs and discussed the procedure including the risks, benefits and alternatives for the proposed anesthesia with the patient or authorized representative who has indicated his/her understanding and acceptance.   Dental advisory given  Plan Discussed with: CRNA  Anesthesia Plan Comments:        Anesthesia Quick Evaluation

## 2018-01-16 ENCOUNTER — Encounter (HOSPITAL_COMMUNITY): Payer: Self-pay

## 2018-01-16 ENCOUNTER — Encounter (HOSPITAL_COMMUNITY)
Admission: RE | Disposition: A | Discharge status: Home / Self Care | Payer: Self-pay | Source: Ambulatory Visit | Attending: Obstetrics and Gynecology

## 2018-01-16 ENCOUNTER — Observation Stay (HOSPITAL_COMMUNITY)
Admission: RE | Admit: 2018-01-16 | Discharge: 2018-01-17 | Disposition: A | Discharge status: Home / Self Care | Payer: 59 | Source: Ambulatory Visit | Attending: Obstetrics and Gynecology | Admitting: Obstetrics and Gynecology

## 2018-01-16 ENCOUNTER — Ambulatory Visit (HOSPITAL_COMMUNITY): Payer: 59 | Admitting: Anesthesiology

## 2018-01-16 DIAGNOSIS — N800 Endometriosis of uterus: Secondary | ICD-10-CM | POA: Diagnosis not present

## 2018-01-16 DIAGNOSIS — Z90710 Acquired absence of both cervix and uterus: Secondary | ICD-10-CM | POA: Diagnosis present

## 2018-01-16 DIAGNOSIS — D251 Intramural leiomyoma of uterus: Secondary | ICD-10-CM | POA: Diagnosis not present

## 2018-01-16 DIAGNOSIS — E739 Lactose intolerance, unspecified: Secondary | ICD-10-CM | POA: Diagnosis not present

## 2018-01-16 DIAGNOSIS — N920 Excessive and frequent menstruation with regular cycle: Secondary | ICD-10-CM | POA: Diagnosis not present

## 2018-01-16 DIAGNOSIS — D259 Leiomyoma of uterus, unspecified: Secondary | ICD-10-CM | POA: Diagnosis not present

## 2018-01-16 HISTORY — PX: ROBOTIC ASSISTED TOTAL HYSTERECTOMY: SHX6085

## 2018-01-16 LAB — TYPE AND SCREEN
ABO/RH(D): O POS
Antibody Screen: NEGATIVE

## 2018-01-16 SURGERY — HYSTERECTOMY, TOTAL, ROBOT-ASSISTED
Anesthesia: General | Laterality: Bilateral

## 2018-01-16 MED ORDER — PROPOFOL 10 MG/ML IV BOLUS
INTRAVENOUS | Status: AC
  Filled 2018-01-16: qty 40

## 2018-01-16 MED ORDER — HYDRALAZINE HCL 20 MG/ML IJ SOLN: 5.0000 mg | INTRAMUSCULAR | Status: DC | PRN

## 2018-01-16 MED ORDER — HYDROMORPHONE HCL 1 MG/ML IJ SOLN: 0.2500 mg | INTRAMUSCULAR | Status: DC | PRN

## 2018-01-16 MED ORDER — TRIAMCINOLONE ACETONIDE 40 MG/ML IJ SUSP
INTRAMUSCULAR | Status: DC | PRN
  Administered 2018-01-16: 40 mg

## 2018-01-16 MED ORDER — SENNA 8.6 MG PO TABS
1.0000 | ORAL_TABLET | Freq: Two times a day (BID) | ORAL | Status: DC
  Administered 2018-01-16: 8.6 mg via ORAL
  Filled 2018-01-16 (×2): qty 1

## 2018-01-16 MED ORDER — HYDRALAZINE HCL 20 MG/ML IJ SOLN
10.0000 mg | Freq: Once | INTRAMUSCULAR | Status: AC
  Administered 2018-01-16: 10 mg via INTRAVENOUS

## 2018-01-16 MED ORDER — SODIUM CHLORIDE 0.9 % IR SOLN
Status: DC | PRN
  Administered 2018-01-16: 3000 mL

## 2018-01-16 MED ORDER — FENTANYL CITRATE (PF) 250 MCG/5ML IJ SOLN
INTRAMUSCULAR | Status: AC
  Filled 2018-01-16: qty 5

## 2018-01-16 MED ORDER — METHYLENE BLUE 0.5 % INJ SOLN
INTRAVENOUS | Status: AC
  Filled 2018-01-16: qty 10

## 2018-01-16 MED ORDER — HYDRALAZINE HCL 20 MG/ML IJ SOLN: 10.0000 mg | INTRAMUSCULAR | Status: DC | PRN

## 2018-01-16 MED ORDER — ROCURONIUM BROMIDE 10 MG/ML (PF) SYRINGE
PREFILLED_SYRINGE | INTRAVENOUS | Status: AC
  Filled 2018-01-16: qty 10

## 2018-01-16 MED ORDER — HYDROMORPHONE HCL 1 MG/ML IJ SOLN
INTRAMUSCULAR | Status: AC
  Filled 2018-01-16: qty 1

## 2018-01-16 MED ORDER — DEXAMETHASONE SODIUM PHOSPHATE 10 MG/ML IJ SOLN
INTRAMUSCULAR | Status: AC
  Filled 2018-01-16: qty 1

## 2018-01-16 MED ORDER — KETOROLAC TROMETHAMINE 30 MG/ML IJ SOLN
INTRAMUSCULAR | Status: AC
  Filled 2018-01-16: qty 1

## 2018-01-16 MED ORDER — ONDANSETRON HCL 4 MG PO TABS
4.0000 mg | ORAL_TABLET | Freq: Four times a day (QID) | ORAL | Status: DC | PRN
  Filled 2018-01-16: qty 1

## 2018-01-16 MED ORDER — SIMETHICONE 80 MG PO CHEW
CHEWABLE_TABLET | ORAL | Status: AC
  Filled 2018-01-16: qty 1

## 2018-01-16 MED ORDER — KETOROLAC TROMETHAMINE 30 MG/ML IJ SOLN: 30.0000 mg | Freq: Four times a day (QID) | INTRAMUSCULAR | Status: DC

## 2018-01-16 MED ORDER — DEXAMETHASONE SODIUM PHOSPHATE 10 MG/ML IJ SOLN
INTRAMUSCULAR | Status: DC | PRN
  Administered 2018-01-16: 10 mg via INTRAVENOUS

## 2018-01-16 MED ORDER — SODIUM CHLORIDE 0.9 % IV SOLN
INTRAVENOUS | Status: DC | PRN
  Administered 2018-01-16: 50 mL
  Administered 2018-01-16: 60 mL
  Administered 2018-01-16: 10 mL

## 2018-01-16 MED ORDER — LACTATED RINGERS IV SOLN
INTRAVENOUS | Status: DC
  Administered 2018-01-16 (×2): via INTRAVENOUS

## 2018-01-16 MED ORDER — SODIUM CHLORIDE 0.9 % IV SOLN
2.0000 g | INTRAVENOUS | Status: AC
  Administered 2018-01-16: 2 g via INTRAVENOUS
  Filled 2018-01-16: qty 2

## 2018-01-16 MED ORDER — HYDROMORPHONE HCL 1 MG/ML IJ SOLN
0.2000 mg | INTRAMUSCULAR | Status: DC | PRN
  Administered 2018-01-16 (×2): 0.25 mg via INTRAVENOUS

## 2018-01-16 MED ORDER — OXYCODONE-ACETAMINOPHEN 5-325 MG PO TABS
1.0000 | ORAL_TABLET | ORAL | Status: DC | PRN
  Administered 2018-01-17 (×2): 1 via ORAL

## 2018-01-16 MED ORDER — ONDANSETRON HCL 4 MG/2ML IJ SOLN
INTRAMUSCULAR | Status: AC
  Filled 2018-01-16: qty 2

## 2018-01-16 MED ORDER — ONDANSETRON HCL 4 MG/2ML IJ SOLN
INTRAMUSCULAR | Status: DC | PRN
  Administered 2018-01-16: 4 mg via INTRAVENOUS

## 2018-01-16 MED ORDER — HYDRALAZINE HCL 20 MG/ML IJ SOLN
INTRAMUSCULAR | Status: AC
  Filled 2018-01-16: qty 1

## 2018-01-16 MED ORDER — HYDROMORPHONE HCL 1 MG/ML IJ SOLN
INTRAMUSCULAR | Status: DC | PRN
  Administered 2018-01-16: 2 mg via INTRAVENOUS

## 2018-01-16 MED ORDER — KETOROLAC TROMETHAMINE 30 MG/ML IJ SOLN
30.0000 mg | Freq: Four times a day (QID) | INTRAMUSCULAR | Status: DC
  Administered 2018-01-16 – 2018-01-17 (×3): 30 mg via INTRAVENOUS

## 2018-01-16 MED ORDER — LIDOCAINE 2% (20 MG/ML) 5 ML SYRINGE
INTRAMUSCULAR | Status: AC
  Filled 2018-01-16: qty 5

## 2018-01-16 MED ORDER — PROMETHAZINE HCL 25 MG/ML IJ SOLN
INTRAMUSCULAR | Status: AC
  Filled 2018-01-16: qty 1

## 2018-01-16 MED ORDER — HYDROMORPHONE HCL 2 MG/ML IJ SOLN
INTRAMUSCULAR | Status: AC
  Filled 2018-01-16: qty 1

## 2018-01-16 MED ORDER — ARTIFICIAL TEARS OPHTHALMIC OINT
TOPICAL_OINTMENT | OPHTHALMIC | Status: DC | PRN
  Administered 2018-01-16: 1 via OPHTHALMIC

## 2018-01-16 MED ORDER — SODIUM CHLORIDE 0.9 % IV SOLN
INTRAVENOUS | Status: AC | PRN
  Administered 2018-01-16: 30 mL via INTRAMUSCULAR

## 2018-01-16 MED ORDER — ONDANSETRON HCL 4 MG/2ML IJ SOLN
4.0000 mg | Freq: Four times a day (QID) | INTRAMUSCULAR | Status: DC | PRN
  Administered 2018-01-16: 4 mg via INTRAVENOUS

## 2018-01-16 MED ORDER — KETOROLAC TROMETHAMINE 30 MG/ML IJ SOLN
INTRAMUSCULAR | Status: DC | PRN
  Administered 2018-01-16: 30 mg via INTRAVENOUS

## 2018-01-16 MED ORDER — ALUM & MAG HYDROXIDE-SIMETH 200-200-20 MG/5ML PO SUSP
30.0000 mL | ORAL | Status: DC | PRN
  Filled 2018-01-16: qty 30

## 2018-01-16 MED ORDER — FENTANYL CITRATE (PF) 100 MCG/2ML IJ SOLN
INTRAMUSCULAR | Status: AC
  Filled 2018-01-16: qty 2

## 2018-01-16 MED ORDER — SUGAMMADEX SODIUM 200 MG/2ML IV SOLN
INTRAVENOUS | Status: AC
  Filled 2018-01-16: qty 2

## 2018-01-16 MED ORDER — LACTATED RINGERS IV SOLN
INTRAVENOUS | Status: DC
  Administered 2018-01-16 (×2): via INTRAVENOUS

## 2018-01-16 MED ORDER — ROCURONIUM BROMIDE 100 MG/10ML IV SOLN
INTRAVENOUS | Status: DC | PRN
  Administered 2018-01-16 (×2): 20 mg via INTRAVENOUS
  Administered 2018-01-16: 50 mg via INTRAVENOUS
  Administered 2018-01-16: 10 mg via INTRAVENOUS

## 2018-01-16 MED ORDER — SUGAMMADEX SODIUM 200 MG/2ML IV SOLN
INTRAVENOUS | Status: DC | PRN
  Administered 2018-01-16: 200 mg via INTRAVENOUS

## 2018-01-16 MED ORDER — MENTHOL 3 MG MT LOZG: 1.0000 | LOZENGE | OROMUCOSAL | Status: DC | PRN

## 2018-01-16 MED ORDER — SIMETHICONE 80 MG PO CHEW
80.0000 mg | CHEWABLE_TABLET | Freq: Four times a day (QID) | ORAL | Status: DC | PRN
  Administered 2018-01-16: 80 mg via ORAL
  Filled 2018-01-16 (×2): qty 1

## 2018-01-16 MED ORDER — TRIAMCINOLONE ACETONIDE 40 MG/ML IJ SUSP
INTRAMUSCULAR | Status: AC
  Filled 2018-01-16: qty 1

## 2018-01-16 MED ORDER — ARTIFICIAL TEARS OPHTHALMIC OINT
TOPICAL_OINTMENT | OPHTHALMIC | Status: AC
  Filled 2018-01-16: qty 3.5

## 2018-01-16 MED ORDER — STERILE WATER FOR IRRIGATION IR SOLN
Status: DC | PRN
  Administered 2018-01-16: 200 mL via INTRAVESICAL

## 2018-01-16 MED ORDER — SODIUM CHLORIDE 0.9 % IJ SOLN: INTRAMUSCULAR | Status: DC | PRN

## 2018-01-16 MED ORDER — LABETALOL HCL 5 MG/ML IV SOLN: 40.0000 mg | INTRAVENOUS | Status: DC | PRN

## 2018-01-16 MED ORDER — OXYCODONE-ACETAMINOPHEN 5-325 MG PO TABS: 2.0000 | ORAL_TABLET | ORAL | Status: DC | PRN

## 2018-01-16 MED ORDER — MIDAZOLAM HCL 2 MG/2ML IJ SOLN
INTRAMUSCULAR | Status: AC
  Filled 2018-01-16: qty 2

## 2018-01-16 MED ORDER — LIDOCAINE HCL (CARDIAC) PF 100 MG/5ML IV SOSY
PREFILLED_SYRINGE | INTRAVENOUS | Status: DC | PRN
  Administered 2018-01-16: 100 mg via INTRAVENOUS

## 2018-01-16 MED ORDER — IBUPROFEN 200 MG PO TABS
800.0000 mg | ORAL_TABLET | Freq: Three times a day (TID) | ORAL | Status: DC | PRN
  Administered 2018-01-17: 800 mg via ORAL

## 2018-01-16 MED ORDER — SODIUM CHLORIDE 0.9 % IJ SOLN
INTRAMUSCULAR | Status: AC
  Filled 2018-01-16: qty 50

## 2018-01-16 MED ORDER — LABETALOL HCL 5 MG/ML IV SOLN: 20.0000 mg | INTRAVENOUS | Status: DC | PRN

## 2018-01-16 MED ORDER — FENTANYL CITRATE (PF) 100 MCG/2ML IJ SOLN
INTRAMUSCULAR | Status: DC | PRN
  Administered 2018-01-16 (×4): 100 ug via INTRAVENOUS
  Administered 2018-01-16: 150 ug via INTRAVENOUS
  Administered 2018-01-16: 50 ug via INTRAVENOUS

## 2018-01-16 MED ORDER — PROPOFOL 10 MG/ML IV BOLUS
INTRAVENOUS | Status: DC | PRN
  Administered 2018-01-16: 150 mg via INTRAVENOUS

## 2018-01-16 MED ORDER — PROMETHAZINE HCL 25 MG/ML IJ SOLN
12.5000 mg | Freq: Once | INTRAMUSCULAR | Status: AC
  Administered 2018-01-16: 12.5 mg via INTRAVENOUS

## 2018-01-16 MED ORDER — MIDAZOLAM HCL 5 MG/5ML IJ SOLN
INTRAMUSCULAR | Status: DC | PRN
  Administered 2018-01-16: 2 mg via INTRAVENOUS

## 2018-01-16 SURGICAL SUPPLY — 49 items
CATH FOLEY 3WAY  5CC 16FR (CATHETERS) ×2
CATH FOLEY 3WAY 5CC 16FR (CATHETERS) ×1 IMPLANT
CONT PATH 16OZ SNAP LID 3702 (MISCELLANEOUS) ×3 IMPLANT
COVER BACK TABLE 60X90IN (DRAPES) ×3 IMPLANT
COVER TIP SHEARS 8 DVNC (MISCELLANEOUS) ×1 IMPLANT
COVER TIP SHEARS 8MM DA VINCI (MISCELLANEOUS) ×2
DECANTER SPIKE VIAL GLASS SM (MISCELLANEOUS) ×6 IMPLANT
DEFOGGER SCOPE WARMER CLEARIFY (MISCELLANEOUS) ×3 IMPLANT
DERMABOND ADVANCED (GAUZE/BANDAGES/DRESSINGS) ×2
DERMABOND ADVANCED .7 DNX12 (GAUZE/BANDAGES/DRESSINGS) ×1 IMPLANT
DRAPE ARM DVNC X/XI (DISPOSABLE) ×4 IMPLANT
DRAPE COLUMN DVNC XI (DISPOSABLE) ×1 IMPLANT
DRAPE DA VINCI XI ARM (DISPOSABLE) ×8
DRAPE DA VINCI XI COLUMN (DISPOSABLE) ×2
DURAPREP 26ML APPLICATOR (WOUND CARE) ×3 IMPLANT
ELECT REM PT RETURN 15FT ADLT (MISCELLANEOUS) ×3 IMPLANT
GLOVE BIOGEL M 6.5 STRL (GLOVE) ×12 IMPLANT
GLOVE BIOGEL PI IND STRL 7.0 (GLOVE) ×6 IMPLANT
GLOVE BIOGEL PI INDICATOR 7.0 (GLOVE) ×12
IRRIG SUCT STRYKERFLOW 2 WTIP (MISCELLANEOUS) ×3
IRRIGATION SUCT STRKRFLW 2 WTP (MISCELLANEOUS) ×1 IMPLANT
LEGGING LITHOTOMY PAIR STRL (DRAPES) ×3 IMPLANT
OBTURATOR OPTICAL STANDARD 8MM (TROCAR) ×2
OBTURATOR OPTICAL STND 8 DVNC (TROCAR) ×1
OBTURATOR OPTICALSTD 8 DVNC (TROCAR) ×1 IMPLANT
OCCLUDER COLPOPNEUMO (BALLOONS) ×3 IMPLANT
PACK ROBOT WH (CUSTOM PROCEDURE TRAY) ×3 IMPLANT
PACK ROBOTIC GOWN (GOWN DISPOSABLE) ×3 IMPLANT
PACK TRENDGUARD 450 HYBRID PRO (MISCELLANEOUS) ×1 IMPLANT
PAD PREP 24X48 CUFFED NSTRL (MISCELLANEOUS) ×3 IMPLANT
POSITIONER SURGICAL ARM (MISCELLANEOUS) ×3 IMPLANT
SEAL CANN UNIV 5-8 DVNC XI (MISCELLANEOUS) ×3 IMPLANT
SEAL XI 5MM-8MM UNIVERSAL (MISCELLANEOUS) ×6
SET CYSTO W/LG BORE CLAMP LF (SET/KITS/TRAYS/PACK) ×3 IMPLANT
SET TRI-LUMEN FLTR TB AIRSEAL (TUBING) ×3 IMPLANT
SUT VIC AB 0 CT1 27 (SUTURE) ×4
SUT VIC AB 0 CT1 27XBRD ANBCTR (SUTURE) ×2 IMPLANT
SUT VICRYL 0 UR6 27IN ABS (SUTURE) IMPLANT
SUT VICRYL RAPIDE 4/0 PS 2 (SUTURE) ×9 IMPLANT
SUT VLOC 180 0 9IN  GS21 (SUTURE) ×2
SUT VLOC 180 0 9IN GS21 (SUTURE) ×1 IMPLANT
TIP UTERINE 6.7X10CM GRN DISP (MISCELLANEOUS) ×3 IMPLANT
TIP UTERINE 6.7X8CM BLUE DISP (MISCELLANEOUS) ×3 IMPLANT
TOWEL OR 17X26 10 PK STRL BLUE (TOWEL DISPOSABLE) ×6 IMPLANT
TRENDGUARD 450 HYBRID PRO PACK (MISCELLANEOUS) ×3
TROCAR BLADELESS OPT 5 100 (ENDOMECHANICALS) ×3 IMPLANT
TROCAR PORT AIRSEAL 8X120 (TROCAR) ×3 IMPLANT
TROCAR XCEL NON BLADE 8MM B8LT (ENDOMECHANICALS) ×3 IMPLANT
WATER STERILE IRR 1000ML POUR (IV SOLUTION) ×3 IMPLANT

## 2018-01-16 NOTE — Progress Notes (Signed)
At 1500, patient's BP was 172/95, HR 84. Dr. Landry Mellow was notified and new orders were given for a one time dose of IV apresoline 10mg . BP was rechecked at 1530 and it was 150/91.

## 2018-01-16 NOTE — Anesthesia Procedure Notes (Signed)
Procedure Name: Intubation Date/Time: 01/16/2018 8:45 AM Performed by: Jonna Munro, CRNA Pre-anesthesia Checklist: Patient identified, Emergency Drugs available, Suction available, Patient being monitored and Timeout performed Patient Re-evaluated:Patient Re-evaluated prior to induction Oxygen Delivery Method: Circle system utilized Preoxygenation: Pre-oxygenation with 100% oxygen Induction Type: IV induction Ventilation: Mask ventilation without difficulty Laryngoscope Size: Mac and 3 Grade View: Grade I Tube type: Oral Tube size: 7.0 mm Number of attempts: 1 Airway Equipment and Method: Stylet Placement Confirmation: ETT inserted through vocal cords under direct vision,  positive ETCO2 and breath sounds checked- equal and bilateral Secured at: 21 cm Tube secured with: Tape Dental Injury: Teeth and Oropharynx as per pre-operative assessment

## 2018-01-16 NOTE — Op Note (Signed)
01/16/2018  4:20 PM  PATIENT:  Alexis Meyer  32 y.o. female  PRE-OPERATIVE DIAGNOSIS:  D25.1 Fibroids, Intramural  POST-OPERATIVE DIAGNOSIS:  D25.1 Fibroids, Intramural  PROCEDURE:  Procedure(s): XI ROBOTIC ASSISTED TOTAL HYSTERECTOMY WITH BILATERAL SALPINGECTOMY (Bilateral)  SURGEON:  Surgeon(s) and Role:    Christophe Louis, MD - Primary    * Thurnell Lose, MD - Assisting  PHYSICIAN ASSISTANT:   ASSISTANTS: Dr. Thurnell Lose   ANESTHESIA:   general  EBL:  50 mL   BLOOD ADMINISTERED:none  DRAINS: none   LOCAL MEDICATIONS USED:  OTHER ropivicaine   SPECIMEN:  Source of Specimen:  Uterus cervix and bilateral fallopian tubes   DISPOSITION OF SPECIMEN:  PATHOLOGY  COUNTS:  YES  TOURNIQUET:  * No tourniquets in log *  DICTATION: .Dragon Dictation  PLAN OF CARE: Admit for overnight observation  PATIENT DISPOSITION:  PACU - hemodynamically stable.   Delay start of Pharmacological VTE agent (>24hrs) due to surgical blood loss or risk of bleeding: not applicable  Findings: large fibroid uterus normal appearing fallopian tubes and ova  Procedure: The patient was taken to the operating room where she was placed under general anesthesia.Time out was performed. Marland Kitchen She was placed in dorsal lithotomy position and prepped and draped in the usual sterile fashion. A weighted speculum was placed into the vagina. A Deaver was placed anteriorly for retraction. The anterior lip of the cervix was grasped with a single-tooth tenaculum. The vaginal mucosa was injected with 2.5 cc of ropivacaine at the 2/4/ 8 and 10 o'clock positions. The uterus was sounded to 11 cm the cervix was dilated to 6 mm . 0 vicryl suture placed at the 12 and 6:00 positions Of the cervix to facilitate placement of a Ru mi uterine manipulator. The manipulator was placed without difficulty. Weighted speculum and Deaver were removed .  Attention was turned to the patient's abdomen where the scar from the previous  abdominal incision was removed with scalpel due to keloid. An  8 mm trocar was placed 4 cm above the umbilicus. under direct visualization . The pneumoperitoneum was achieved with PCO2 gas. The laparoscope was removed. 60 cc of ropivacaine were injected into the abdominal cavity. The laparoscope was reinserted. An 8 mm trocar was placed in the right upper quadrant 16 centimeters from the umbilicus.later connected to robotic arm #4). An 8MM incision was made in the Right upper quadrant TROCAR WAS PLACED 8 cm from the umbilicus. Later connected to robotic arm #3. An 8 mm incision was made in the left upper quadrant 16 cm from the umbilicus and connected to robot arm #21 . Attention was turned to the left upper quadrant where a 8 mm midclavicular assistant trocar was placed. ( All incision sites were injected with 10cc of ropivacaine prior to port placement. )  Once all ports had been placed under direct visualization.The laparoscope was removed and the [place] robotic system was thin right-sided docked. The robotic arms were connected to the corresponding trocars as listed above. The laparoscope was then reinserted. The  Bipolar long tip forces were  placed into port #1. The monopolar scissor placed in the port #3. A prograsp was placed in port #4. All instruments were directed into the pelvis under direct visualization.  Attention was turned to the surgeons console.. The left mesosalpinx and left utero-ovarian ligament was cauterized with long tip forceps and excised with scissors. The broad ligament was cauterized with long tip forceps then incised with scissors. The round ligament  was cauterized with the long tip forceps then  incised with scissors. The anterior leaf of broad ligament was incised along the bladder reflection to the midline.  The right mesosalpinx and right utero-ovarian ligament was cauterized with long tip forceps  and excised with scissors. The right broad ligament was cauterized with long  tip forceps  excised scissors. The right round ligament was cauterized with long tip forceps and excised with scissors. The broad ligament was incised to the midline. The bladder was dissected off the lower uterine segments of the cervix via sharp and blunt dissection. The bladder was filled in a retrograde fashion with methylene blue and sterile water to help with dissection Of the bladder from the lower uterine segment.  The uterine arteries were skeleton bilaterally. They were cauterized with long tip forceps  and transected. The KOH ring was identified. The anterior colpotomy was performed followed by the posterior colpotomy.  . Once the uterus,cervix and bilateral fallopian tubes were completely excised it was removed through the vagina.  The scissors were removed and log tip forceps were placed in the port #1 and the cutting needle driver was placed in to port #2.   The vaginal cuff was closed with 0 v-lock suture in a running fashion. The pelvis was irrigated. Marland Kitchen.Excellent hemostasis was noted. All pelvic pedicles were examined and hemostasis was noted.  Arista was placed along the vaginal cuff. All instruments removed from the ports. All ports were removed under direct Visualization. The pneumoperitoneum was released. . The skin incisions were closed with 4-0 Vicryl and then covered with Derma bond.  The vagina was inspected. The patient was noted to have  Bilateral vaginal laceraton that was at the bilateral aspect of the vagina. .. The tissue was reapproxamated with 4-0 vicryl ... Sponge lap and needle counts were correct x. The patient was awakened from anesthesia and taken to the recovery room in stable condition.

## 2018-01-16 NOTE — Anesthesia Postprocedure Evaluation (Signed)
Anesthesia Post Note  Patient: Alexis Meyer  Procedure(s) Performed: XI ROBOTIC ASSISTED TOTAL HYSTERECTOMY WITH BILATERAL SALPINGECTOMY (Bilateral )     Patient location during evaluation: PACU Anesthesia Type: General Level of consciousness: awake and alert Pain management: pain level controlled Vital Signs Assessment: post-procedure vital signs reviewed and stable Respiratory status: spontaneous breathing, nonlabored ventilation, respiratory function stable and patient connected to nasal cannula oxygen Cardiovascular status: blood pressure returned to baseline and stable Postop Assessment: no apparent nausea or vomiting Anesthetic complications: no    Last Vitals:  Vitals:   01/16/18 1330 01/16/18 1355  BP: (!) 155/85 (!) 166/99  Pulse: 83 88  Resp: 15 16  Temp: 37.1 C 36.8 C  SpO2: 95% 93%    Last Pain:  Vitals:   01/16/18 1245  TempSrc:   PainSc: 0-No pain                 Tanis Hensarling L Arturo Freundlich

## 2018-01-16 NOTE — H&P (Signed)
Date of Initial H&P: 01/15/2018 History reviewed, patient examined, no change in status, stable for surgery.

## 2018-01-16 NOTE — Transfer of Care (Signed)
Immediate Anesthesia Transfer of Care Note  Patient: Alexis Meyer  Procedure(s) Performed: XI ROBOTIC ASSISTED TOTAL HYSTERECTOMY WITH BILATERAL SALPINGECTOMY (Bilateral )  Patient Location: PACU  Anesthesia Type:General  Level of Consciousness: awake, alert  and oriented  Airway & Oxygen Therapy: Patient Spontanous Breathing and Patient connected to face mask oxygen  Post-op Assessment: Report given to RN and Post -op Vital signs reviewed and stable  Post vital signs: Reviewed stable  Last Vitals:  Vitals Value Taken Time  BP 161/89 01/16/2018 12:41 PM  Temp    Pulse 105 01/16/2018 12:45 PM  Resp 12 01/16/2018 12:45 PM  SpO2 99 % 01/16/2018 12:45 PM  Vitals shown include unvalidated device data.  Last Pain:  Vitals:   01/16/18 0705  TempSrc:   PainSc: 0-No pain         Complications: No apparent anesthesia complications

## 2018-01-17 ENCOUNTER — Encounter (HOSPITAL_COMMUNITY): Payer: Self-pay | Admitting: Obstetrics and Gynecology

## 2018-01-17 DIAGNOSIS — N920 Excessive and frequent menstruation with regular cycle: Secondary | ICD-10-CM | POA: Diagnosis not present

## 2018-01-17 DIAGNOSIS — N800 Endometriosis of uterus: Secondary | ICD-10-CM | POA: Diagnosis not present

## 2018-01-17 DIAGNOSIS — D251 Intramural leiomyoma of uterus: Secondary | ICD-10-CM | POA: Diagnosis not present

## 2018-01-17 DIAGNOSIS — E739 Lactose intolerance, unspecified: Secondary | ICD-10-CM | POA: Diagnosis not present

## 2018-01-17 LAB — CBC
HEMATOCRIT: 36.6 % (ref 36.0–46.0)
HEMOGLOBIN: 11.4 g/dL — AB (ref 12.0–15.0)
MCH: 27.6 pg (ref 26.0–34.0)
MCHC: 31.1 g/dL (ref 30.0–36.0)
MCV: 88.6 fL (ref 78.0–100.0)
Platelets: 318 10*3/uL (ref 150–400)
RBC: 4.13 MIL/uL (ref 3.87–5.11)
RDW: 15.8 % — ABNORMAL HIGH (ref 11.5–15.5)
WBC: 16.4 10*3/uL — ABNORMAL HIGH (ref 4.0–10.5)

## 2018-01-17 MED ORDER — OXYCODONE-ACETAMINOPHEN 5-325 MG PO TABS: 1.0000 | ORAL_TABLET | ORAL | 0 refills | Status: DC | PRN

## 2018-01-17 MED ORDER — OXYCODONE-ACETAMINOPHEN 5-325 MG PO TABS
ORAL_TABLET | ORAL | Status: AC
  Filled 2018-01-17: qty 1

## 2018-01-17 MED ORDER — KETOROLAC TROMETHAMINE 30 MG/ML IJ SOLN
INTRAMUSCULAR | Status: AC
  Filled 2018-01-17: qty 1

## 2018-01-17 MED ORDER — IBUPROFEN 800 MG PO TABS: 800.0000 mg | ORAL_TABLET | Freq: Three times a day (TID) | ORAL | 1 refills | Status: AC | PRN

## 2018-01-17 MED ORDER — IBUPROFEN 200 MG PO TABS
ORAL_TABLET | ORAL | Status: AC
  Filled 2018-01-17: qty 4

## 2018-01-17 MED FILL — OXYCODONE-ACETAMINOPHEN 5-3: 5-325 | 3 days supply | Qty: 30 | Fill #0

## 2018-01-17 MED FILL — IBUPROFEN 800 MG TAB: 800 | 10 days supply | Qty: 30 | Fill #0

## 2018-01-28 DIAGNOSIS — R350 Frequency of micturition: Secondary | ICD-10-CM | POA: Diagnosis not present

## 2018-01-28 DIAGNOSIS — B351 Tinea unguium: Secondary | ICD-10-CM | POA: Diagnosis not present

## 2018-01-28 DIAGNOSIS — Z0000 Encounter for general adult medical examination without abnormal findings: Secondary | ICD-10-CM | POA: Diagnosis not present

## 2018-01-28 DIAGNOSIS — N898 Other specified noninflammatory disorders of vagina: Secondary | ICD-10-CM | POA: Diagnosis not present

## 2018-01-28 DIAGNOSIS — R5383 Other fatigue: Secondary | ICD-10-CM | POA: Diagnosis not present

## 2018-01-29 NOTE — Discharge Summary (Signed)
Physician Discharge Summary  Patient ID: Alexis Meyer MRN: [id number] DOB/AGE: 34-Apr-1987 34 y.o.  Admit date: 01/16/2018 Discharge date:01/17/2018 Admission Diagnoses:Uterine fibroids / Menorrhagia   Discharge Diagnoses:  Active Problems:   S/P hysterectomy   Discharged Condition: stable  Hospital Course: Patient was admitted for observation after robotic assisted laparoscopic hysterectomy with bilateral salpingectomy. She did welll postoperatively with return of bowel and bladder function by pod #1 ... Pain is well controlled   Consults: None  Significant Diagnostic Studies: labs: hgb pod #1 =11.4  Treatments: surgery:  robotic assisted laparoscopic hysterectomy with bilateral salpingectomy.   Discharge Exam: Blood pressure (!) 157/74, pulse (!) 58, temperature 98.2 F (36.8 C), resp. rate 16, height 5\' 6"  (1.676 m), weight 123 kg, last menstrual period 12/28/2017, SpO2 97 %, not currently breastfeeding. General appearance: no distress Resp: clear to auscultation bilaterally GI: soft appropriately tender nondistended +BS.Marland Kitchen incisions well approximated.  Disposition:   Discharge Instructions    Call MD for:  persistant nausea and vomiting   Complete by:  As directed    Call MD for:  redness, tenderness, or signs of infection (pain, swelling, redness, odor or green/yellow discharge around incision site)   Complete by:  As directed    Call MD for:  severe uncontrolled pain   Complete by:  As directed    Call MD for:  temperature >100.4   Complete by:  As directed    Diet - low sodium heart healthy   Complete by:  As directed    Diet - low sodium heart healthy   Complete by:  As directed    Driving Restrictions   Complete by:  As directed    Avoid driving for 1 week   Increase activity slowly   Complete by:  As directed    Increase activity slowly   Complete by:  As directed    Lifting restrictions   Complete by:  As directed    Avoid lifting over 10 lbs   No  wound care   Complete by:  As directed    Sexual Activity Restrictions   Complete by:  As directed    Avoid sex     Allergies as of 01/17/2018      Reactions   Nickel Hives      Medication List    TAKE these medications   clotrimazole 1 % cream Commonly known as:  LOTRIMIN Apply to affected area 2 times daily   ibuprofen 800 MG tablet Commonly known as:  ADVIL,MOTRIN Take 1 tablet (800 mg total) by mouth every 8 (eight) hours as needed (mild pain).   oxyCODONE-acetaminophen 5-325 MG tablet Commonly known as:  PERCOCET/ROXICET Take 1-2 tablets by mouth every 4 (four) hours as needed for moderate pain ((when tolerating fluids)).   pseudoephedrine 30 MG tablet Commonly known as:  SUDAFED Take 1 tablet (30 mg total) by mouth every 6 (six) hours as needed for congestion.   terbinafine 250 MG tablet Commonly known as:  LAMISIL Take 1 tablet (250 mg total) by mouth daily.      Follow-up Information    Christophe Louis, MD. Go in 2 week(s).   Specialty:  Obstetrics and Gynecology Why:  postoperative appt  Contact information: [address] [place] [address] [phone]           Signed: Catha Brow. 01/29/2018, 2:58 PM

## 2018-03-04 DIAGNOSIS — N898 Other specified noninflammatory disorders of vagina: Secondary | ICD-10-CM | POA: Diagnosis not present

## 2018-03-04 DIAGNOSIS — Z90710 Acquired absence of both cervix and uterus: Secondary | ICD-10-CM | POA: Diagnosis not present

## 2018-03-05 MED FILL — metroNIDAZOLE 500 MG TABS: 500 | 7 days supply | Qty: 14 | Fill #0

## 2018-04-10 ENCOUNTER — Ambulatory Visit (HOSPITAL_COMMUNITY)
Admission: EM | Admit: 2018-04-10 | Discharge: 2018-04-10 | Disposition: A | Discharge status: Home / Self Care | Payer: 59 | Attending: Family Medicine | Admitting: Family Medicine

## 2018-04-10 ENCOUNTER — Encounter (HOSPITAL_COMMUNITY): Payer: Self-pay

## 2018-04-10 DIAGNOSIS — L519 Erythema multiforme, unspecified: Secondary | ICD-10-CM | POA: Diagnosis not present

## 2018-04-10 MED ORDER — TRIAMCINOLONE ACETONIDE 0.1 % EX CREA: 1.0000 "application " | TOPICAL_CREAM | Freq: Two times a day (BID) | CUTANEOUS | 1 refills | Status: DC

## 2018-04-10 NOTE — ED Triage Notes (Signed)
Pt presents ringworm on both arms and her buttocks.

## 2018-04-11 MED FILL — TRIAMCINOLONE 0.1% CREAM: 0.1 | 7 days supply | Qty: 45 | Fill #0

## 2018-04-12 ENCOUNTER — Emergency Department (HOSPITAL_COMMUNITY)
Admission: EM | Admit: 2018-04-12 | Discharge: 2018-04-13 | Disposition: A | Discharge status: Home / Self Care | Payer: 59 | Attending: Emergency Medicine | Admitting: Emergency Medicine

## 2018-04-12 ENCOUNTER — Encounter (HOSPITAL_COMMUNITY): Payer: Self-pay | Admitting: Emergency Medicine

## 2018-04-12 DIAGNOSIS — S3991XA Unspecified injury of abdomen, initial encounter: Secondary | ICD-10-CM | POA: Insufficient documentation

## 2018-04-12 DIAGNOSIS — R1031 Right lower quadrant pain: Secondary | ICD-10-CM | POA: Insufficient documentation

## 2018-04-12 DIAGNOSIS — R109 Unspecified abdominal pain: Secondary | ICD-10-CM | POA: Diagnosis not present

## 2018-04-12 DIAGNOSIS — M5489 Other dorsalgia: Secondary | ICD-10-CM | POA: Insufficient documentation

## 2018-04-12 DIAGNOSIS — M549 Dorsalgia, unspecified: Secondary | ICD-10-CM

## 2018-04-12 DIAGNOSIS — Z79899 Other long term (current) drug therapy: Secondary | ICD-10-CM | POA: Insufficient documentation

## 2018-04-12 DIAGNOSIS — I10 Essential (primary) hypertension: Secondary | ICD-10-CM | POA: Diagnosis not present

## 2018-04-12 DIAGNOSIS — Y9389 Activity, other specified: Secondary | ICD-10-CM | POA: Insufficient documentation

## 2018-04-12 DIAGNOSIS — R52 Pain, unspecified: Secondary | ICD-10-CM | POA: Diagnosis not present

## 2018-04-12 DIAGNOSIS — M546 Pain in thoracic spine: Secondary | ICD-10-CM | POA: Diagnosis not present

## 2018-04-12 DIAGNOSIS — Y999 Unspecified external cause status: Secondary | ICD-10-CM | POA: Insufficient documentation

## 2018-04-12 DIAGNOSIS — Y92410 Unspecified street and highway as the place of occurrence of the external cause: Secondary | ICD-10-CM | POA: Diagnosis not present

## 2018-04-12 DIAGNOSIS — R1011 Right upper quadrant pain: Secondary | ICD-10-CM | POA: Diagnosis not present

## 2018-04-12 DIAGNOSIS — S199XXA Unspecified injury of neck, initial encounter: Secondary | ICD-10-CM | POA: Diagnosis not present

## 2018-04-12 DIAGNOSIS — S7011XA Contusion of right thigh, initial encounter: Secondary | ICD-10-CM | POA: Diagnosis not present

## 2018-04-12 DIAGNOSIS — M25519 Pain in unspecified shoulder: Secondary | ICD-10-CM | POA: Diagnosis not present

## 2018-04-12 MED ORDER — MORPHINE SULFATE (PF) 4 MG/ML IV SOLN
4.0000 mg | Freq: Once | INTRAVENOUS | Status: AC
  Administered 2018-04-13: 4 mg via INTRAVENOUS
  Filled 2018-04-12: qty 1

## 2018-04-12 MED ORDER — KETOROLAC TROMETHAMINE 30 MG/ML IJ SOLN
15.0000 mg | Freq: Once | INTRAMUSCULAR | Status: AC
  Administered 2018-04-13: 15 mg via INTRAVENOUS
  Filled 2018-04-12: qty 1
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
## 2018-04-12 NOTE — ED Notes (Signed)
PA at bedside doing FAST

## 2018-04-13 ENCOUNTER — Emergency Department (HOSPITAL_COMMUNITY): Payer: 59

## 2018-04-13 DIAGNOSIS — S7011XA Contusion of right thigh, initial encounter: Secondary | ICD-10-CM | POA: Diagnosis not present

## 2018-04-13 DIAGNOSIS — R1011 Right upper quadrant pain: Secondary | ICD-10-CM | POA: Diagnosis not present

## 2018-04-13 DIAGNOSIS — S199XXA Unspecified injury of neck, initial encounter: Secondary | ICD-10-CM | POA: Diagnosis not present

## 2018-04-13 DIAGNOSIS — M5489 Other dorsalgia: Secondary | ICD-10-CM | POA: Diagnosis not present

## 2018-04-13 DIAGNOSIS — R109 Unspecified abdominal pain: Secondary | ICD-10-CM | POA: Diagnosis not present

## 2018-04-13 DIAGNOSIS — Z79899 Other long term (current) drug therapy: Secondary | ICD-10-CM | POA: Diagnosis not present

## 2018-04-13 DIAGNOSIS — R1031 Right lower quadrant pain: Secondary | ICD-10-CM | POA: Diagnosis not present

## 2018-04-13 DIAGNOSIS — S3991XA Unspecified injury of abdomen, initial encounter: Secondary | ICD-10-CM | POA: Diagnosis not present

## 2018-04-13 LAB — CBC WITH DIFFERENTIAL/PLATELET
ABS IMMATURE GRANULOCYTES: 0.31 10*3/uL — AB (ref 0.00–0.07)
Basophils Absolute: 0.1 10*3/uL (ref 0.0–0.1)
Basophils Relative: 0 %
Eosinophils Absolute: 0.2 10*3/uL (ref 0.0–0.5)
Eosinophils Relative: 1 %
HCT: 40.5 % (ref 36.0–46.0)
Hemoglobin: 12.2 g/dL (ref 12.0–15.0)
Immature Granulocytes: 2 %
Lymphocytes Relative: 17 %
Lymphs Abs: 2.8 10*3/uL (ref 0.7–4.0)
MCH: 27.6 pg (ref 26.0–34.0)
MCHC: 30.1 g/dL (ref 30.0–36.0)
MCV: 91.6 fL (ref 80.0–100.0)
MONO ABS: 1.1 10*3/uL — AB (ref 0.1–1.0)
MONOS PCT: 7 %
NEUTROS ABS: 11.4 10*3/uL — AB (ref 1.7–7.7)
Neutrophils Relative %: 73 %
Platelets: 260 10*3/uL (ref 150–400)
RBC: 4.42 MIL/uL (ref 3.87–5.11)
RDW: 15.6 % — ABNORMAL HIGH (ref 11.5–15.5)
WBC: 15.8 10*3/uL — ABNORMAL HIGH (ref 4.0–10.5)
nRBC: 0 % (ref 0.0–0.2)

## 2018-04-13 LAB — COMPREHENSIVE METABOLIC PANEL
ALK PHOS: 50 U/L (ref 38–126)
ALT: 22 U/L (ref 0–44)
AST: 40 U/L (ref 15–41)
Albumin: 3.3 g/dL — ABNORMAL LOW (ref 3.5–5.0)
Anion gap: 8 (ref 5–15)
BILIRUBIN TOTAL: 0.5 mg/dL (ref 0.3–1.2)
BUN: 10 mg/dL (ref 6–20)
CALCIUM: 8.5 mg/dL — AB (ref 8.9–10.3)
CO2: 22 mmol/L (ref 22–32)
CREATININE: 0.75 mg/dL (ref 0.44–1.00)
Chloride: 107 mmol/L (ref 98–111)
Glucose, Bld: 107 mg/dL — ABNORMAL HIGH (ref 70–99)
Potassium: 3.6 mmol/L (ref 3.5–5.1)
Sodium: 137 mmol/L (ref 135–145)
TOTAL PROTEIN: 6.9 g/dL (ref 6.5–8.1)

## 2018-04-13 LAB — I-STAT CHEM 8, ED
BUN: 12 mg/dL (ref 6–20)
CHLORIDE: 105 mmol/L (ref 98–111)
CREATININE: 0.7 mg/dL (ref 0.44–1.00)
Calcium, Ion: 1.01 mmol/L — ABNORMAL LOW (ref 1.15–1.40)
GLUCOSE: 104 mg/dL — AB (ref 70–99)
HCT: 38 % (ref 36.0–46.0)
Hemoglobin: 12.9 g/dL (ref 12.0–15.0)
POTASSIUM: 3.9 mmol/L (ref 3.5–5.1)
Sodium: 139 mmol/L (ref 135–145)
TCO2: 27 mmol/L (ref 22–32)

## 2018-04-13 MED ORDER — METHOCARBAMOL 500 MG PO TABS: 500.0000 mg | ORAL_TABLET | Freq: Two times a day (BID) | ORAL | 0 refills | Status: DC

## 2018-04-13 MED ORDER — IOHEXOL 300 MG/ML  SOLN
100.0000 mL | Freq: Once | INTRAMUSCULAR | Status: AC | PRN
  Administered 2018-04-13: 100 mL via INTRAVENOUS

## 2018-04-13 MED ORDER — METHOCARBAMOL 500 MG PO TABS
500.0000 mg | ORAL_TABLET | Freq: Once | ORAL | Status: AC
  Administered 2018-04-13: 500 mg via ORAL
  Filled 2018-04-13: qty 1

## 2018-04-13 MED ORDER — NAPROXEN 500 MG PO TABS: 500.0000 mg | ORAL_TABLET | Freq: Two times a day (BID) | ORAL | 0 refills | Status: DC

## 2018-04-13 MED ORDER — HYDROCODONE-ACETAMINOPHEN 5-325 MG PO TABS
1.0000 | ORAL_TABLET | Freq: Once | ORAL | Status: AC
  Administered 2018-04-13: 1 via ORAL
  Filled 2018-04-13: qty 1

## 2018-04-13 NOTE — Discharge Instructions (Signed)
Take NSAIDs or Tylenol as needed for the next week. Take this medicine with food. Take muscle relaxer at bedtime to help you sleep. This medicine makes you drowsy so do not take before driving or work Use a heating pad for sore muscles - use for 20 minutes several times a day Return for worsening symptoms

## 2018-04-13 NOTE — ED Notes (Signed)
Pt to CT via stretcher

## 2018-04-15 DIAGNOSIS — M549 Dorsalgia, unspecified: Secondary | ICD-10-CM | POA: Diagnosis not present

## 2018-04-15 MED FILL — traMADol HCL 50 MG TABS: 50 | 14 days supply | Qty: 14 | Fill #0

## 2018-04-17 NOTE — ED Provider Notes (Signed)
[facility]   [id number] 04/10/18 Arrival Time: 2951  ASSESSMENT & PLAN:  1. Erythema multiforme    Meds ordered this encounter  Medications  . triamcinolone cream (KENALOG) 0.1 %    Sig: Apply 1 application topically 2 (two) times daily.    Dispense:  45 g    Refill:  1   Written information of erythema multiforme given. Will follow up with PCP or here if worsening or failing to improve as anticipated. Reviewed expectations re: course of current medical issues. Questions answered. Outlined signs and symptoms indicating need for more acute intervention. Patient verbalized understanding. After Visit Summary given.   SUBJECTIVE:  Alexis Meyer is a 32 y.o. female who presents with a skin complaint.   Location: bilateral forearms; similar in the past with gradual resolution Onset: gradual Duration: few days to one week Associated pruritis? none Associated pain? none Progression: stable  Drainage? No  Known trigger? No  New soaps/lotions/topicals/detergents/environmental exposures? No Contacts with similar? No Recent travel? No  Other associated symptoms: none Therapies tried thus far: none Arthralgia or myalgia? none Recent illness? none Fever? none No specific aggravating or alleviating factors reported.  ROS: As per HPI.  OBJECTIVE: Vitals:   04/10/18 1805  BP: (!) 143/93  Pulse: 83  Resp: 20  Temp: 98.1 F (36.7 C)  TempSrc: Oral  SpO2: 100%    General appearance: alert; no distress Lungs: clear to auscultation bilaterally Heart: regular rate and rhythm Extremities: no edema Skin: warm and dry; signs of infection: no; bilateral forearms with scattered slightly raised lesions consistent with target lesions of erythema multiforme Psychological: alert and cooperative; normal mood and affect  Allergies  Allergen Reactions  . Nickel Hives    Past Medical History:  Diagnosis Date  . Anxiety   . Bipolar disorder ([place] [address])    has RX but  doesn't take it  . Complication of anesthesia    slow to wake from surgery , extra groggy afterwards   . Depression   . GERD (gastroesophageal reflux disease)   . Herpes   . Ringworm of body   . Urinary tract infection    patient denies    Social History   Socioeconomic History  . Marital status: Single    Spouse name: Not on file  . Number of children: Not on file  . Years of education: Not on file  . Highest education level: Not on file  Occupational History  . Not on file  Social Needs  . Financial resource strain: Not on file  . Food insecurity:    Worry: Not on file    Inability: Not on file  . Transportation needs:    Medical: Not on file    Non-medical: Not on file  Tobacco Use  . Smoking status: Never Smoker  . Smokeless tobacco: Never Used  Substance and Sexual Activity  . Alcohol use: No  . Drug use: No  . Sexual activity: Yes    Birth control/protection: Condom  Lifestyle  . Physical activity:    Days per week: Not on file    Minutes per session: Not on file  . Stress: Not on file  Relationships  . Social connections:    Talks on phone: Not on file    Gets together: Not on file    Attends religious service: Not on file    Active member of club or organization: Not on file    Attends meetings of clubs or organizations: Not on file  Relationship status: Not on file  . Intimate partner violence:    Fear of current or ex partner: Not on file    Emotionally abused: Not on file    Physically abused: Not on file    Forced sexual activity: Not on file  Other Topics Concern  . Not on file  Social History Narrative  . Not on file   Family History  Adopted: Yes   Past Surgical History:  Procedure Laterality Date  . CESAREAN SECTION N/A 12/04/2016   Procedure: CESAREAN SECTION;  Surgeon: Christophe Louis, MD;  Location: [facility];  Service: Obstetrics;  Laterality: N/A;  EDD 12/24/16  . FRACTURE SURGERY     left wrist  . ROBOT ASSISTED MYOMECTOMY  N/A 05/26/2015   Procedure: ROBOTIC ASSISTED MYOMECTOMY;  Surgeon: Governor Specking, MD;  Location: [facility];  Service: Gynecology;  Laterality: N/A;  Dr. MODY will assist   . ROBOTIC ASSISTED TOTAL HYSTERECTOMY Bilateral 01/16/2018   Procedure: XI ROBOTIC ASSISTED TOTAL HYSTERECTOMY WITH BILATERAL SALPINGECTOMY;  Surgeon: Christophe Louis, MD;  Location: [facility];  Service: Gynecology;  Laterality: Bilateral;  . WRIST SURGERY       Vanessa Kick, MD 04/17/18 [phone]

## 2018-04-24 DIAGNOSIS — M549 Dorsalgia, unspecified: Secondary | ICD-10-CM | POA: Diagnosis not present

## 2018-05-14 DIAGNOSIS — M549 Dorsalgia, unspecified: Secondary | ICD-10-CM | POA: Diagnosis not present

## 2018-05-28 ENCOUNTER — Encounter (HOSPITAL_COMMUNITY): Payer: Self-pay | Admitting: Emergency Medicine

## 2018-05-28 ENCOUNTER — Ambulatory Visit (HOSPITAL_COMMUNITY)
Admission: EM | Admit: 2018-05-28 | Discharge: 2018-05-28 | Disposition: A | Discharge status: Home / Self Care | Payer: 59 | Attending: Family Medicine | Admitting: Family Medicine

## 2018-05-28 DIAGNOSIS — J320 Chronic maxillary sinusitis: Secondary | ICD-10-CM | POA: Diagnosis not present

## 2018-05-28 DIAGNOSIS — H65112 Acute and subacute allergic otitis media (mucoid) (sanguinous) (serous), left ear: Secondary | ICD-10-CM | POA: Insufficient documentation

## 2018-05-28 DIAGNOSIS — J40 Bronchitis, not specified as acute or chronic: Secondary | ICD-10-CM | POA: Diagnosis not present

## 2018-05-28 MED ORDER — AMOXICILLIN 875 MG PO TABS: 875.0000 mg | ORAL_TABLET | Freq: Two times a day (BID) | ORAL | 0 refills | Status: AC

## 2018-05-28 MED ORDER — HYDROCODONE-HOMATROPINE 5-1.5 MG/5ML PO SYRP: 5.0000 mL | ORAL_SOLUTION | Freq: Four times a day (QID) | ORAL | 0 refills | Status: AC | PRN

## 2018-05-28 MED FILL — AMOXICILLIN 875 MG TABLET: 875 | 10 days supply | Qty: 20 | Fill #0

## 2018-05-28 MED FILL — HYDROCODONE-HOMATROPINE SOL: 5-1.5 | 3 days supply | Qty: 60 | Fill #0

## 2018-05-28 NOTE — ED Provider Notes (Signed)
[place]    CSN: [id number] Arrival date & time: 05/28/18  6644     History   Chief Complaint Chief Complaint  Patient presents with  . URI    HPI Alexis Meyer is a 32 y.o. female.   32 year old female established patient here at [facility] urgent care complaining of 4 days of upper respiratory symptoms.  She has had some muffled hearing in her left ear as well as a cough and sore throat.  Patient works as an Tax adviser.     Past Medical History:  Diagnosis Date  . Anxiety   . Bipolar disorder ([place])    has RX but doesn't take it  . Complication of anesthesia    slow to wake from surgery , extra groggy afterwards   . Depression   . GERD (gastroesophageal reflux disease)   . Herpes   . Ringworm of body   . Urinary tract infection    patient denies     Patient Active Problem List   Diagnosis Date Noted  . S/P hysterectomy 01/16/2018  . S/P cesarean section 12/04/2016  . Bipolar II disorder ([place]) 10/29/2013    Past Surgical History:  Procedure Laterality Date  . CESAREAN SECTION N/A 12/04/2016   Procedure: CESAREAN SECTION;  Surgeon: Christophe Louis, MD;  Location: [place];  Service: Obstetrics;  Laterality: N/A;  EDD 12/24/16  . FRACTURE SURGERY     left wrist  . ROBOT ASSISTED MYOMECTOMY N/A 05/26/2015   Procedure: ROBOTIC ASSISTED MYOMECTOMY;  Surgeon: Governor Specking, MD;  Location: [facility];  Service: Gynecology;  Laterality: N/A;  Dr. MODY will assist   . ROBOTIC ASSISTED TOTAL HYSTERECTOMY Bilateral 01/16/2018   Procedure: XI ROBOTIC ASSISTED TOTAL HYSTERECTOMY WITH BILATERAL SALPINGECTOMY;  Surgeon: Christophe Louis, MD;  Location: [facility];  Service: Gynecology;  Laterality: Bilateral;  . WRIST SURGERY      OB History    Gravida  4   Para  1   Term      Preterm  1   AB  2   Living  1     SAB  2   TAB      Ectopic      Multiple  0   Live Births  1            Home Medications    Prior to Admission medications     Medication Sig Start Date End Date Taking? Authorizing Provider  amoxicillin (AMOXIL) 875 MG tablet Take 1 tablet (875 mg total) by mouth 2 (two) times daily. 05/28/18   Robyn Haber, MD  HYDROcodone-homatropine (HYDROMET) 5-1.5 MG/5ML syrup Take 5 mLs by mouth every 6 (six) hours as needed for cough. 05/28/18   Robyn Haber, MD  ibuprofen (ADVIL,MOTRIN) 800 MG tablet Take 1 tablet (800 mg total) by mouth every 8 (eight) hours as needed (mild pain). 01/17/18   Christophe Louis, MD    Family History Family History  Adopted: Yes    Social History Social History   Tobacco Use  . Smoking status: Never Smoker  . Smokeless tobacco: Never Used  Substance Use Topics  . Alcohol use: No  . Drug use: No     Allergies   Nickel   Review of Systems Review of Systems   Physical Exam Triage Vital Signs ED Triage Vitals  Enc Vitals Group     BP 05/28/18 1009 (!) 136/95     Pulse Rate 05/28/18 1009 (!) 101  Resp 05/28/18 1009 16     Temp 05/28/18 1009 98.6 F (37 C)     Temp Source 05/28/18 1009 Oral     SpO2 05/28/18 1009 97 %     Weight --      Height --      Head Circumference --      Peak Flow --      Pain Score 05/28/18 1011 5     Pain Loc --      Pain Edu? --      Excl. in [place]? --    No data found.  Updated Vital Signs BP (!) 136/95 (BP Location: Left Arm)   Pulse (!) 101   Temp 98.6 F (37 C) (Oral)   Resp 16   LMP 12/28/2017   SpO2 97%   Physical Exam Vitals signs and nursing note reviewed.  Constitutional:      Appearance: Normal appearance. She is obese.  HENT:     Head: Normocephalic and atraumatic.     Right Ear: External ear normal.     Left Ear: External ear normal.     Ears:     Comments: Bilateral TM retraction with erythema on left    Nose: Congestion present.     Mouth/Throat:     Mouth: Mucous membranes are moist.     Pharynx: Posterior oropharyngeal erythema present.  Eyes:     Conjunctiva/sclera: Conjunctivae normal.  Neck:      Musculoskeletal: Normal range of motion and neck supple.  Cardiovascular:     Heart sounds: Normal heart sounds.  Pulmonary:     Effort: Pulmonary effort is normal.     Breath sounds: Normal breath sounds.  Musculoskeletal: Normal range of motion.  Skin:    General: Skin is warm and dry.  Neurological:     General: No focal deficit present.     Mental Status: She is alert and oriented to person, place, and time.  Psychiatric:        Mood and Affect: Mood normal.        Behavior: Behavior normal.      UC Treatments / Results  Labs (all labs ordered are listed, but only abnormal results are displayed) Labs Reviewed - No data to display  EKG None  Radiology No results found.  Procedures Procedures (including critical care time)  Medications Ordered in UC Medications - No data to display  Initial Impression / Assessment and Plan / UC Course  I have reviewed the triage vital signs and the nursing notes.  Pertinent labs & imaging results that were available during my care of the patient were reviewed by me and considered in my medical decision making (see chart for details).    Final Clinical Impressions(s) / UC Diagnoses   Final diagnoses:  Acute mucoid otitis media of left ear  Chronic maxillary sinusitis  Bronchitis   Discharge Instructions   None    ED Prescriptions    Medication Sig Dispense Auth. Provider   amoxicillin (AMOXIL) 875 MG tablet Take 1 tablet (875 mg total) by mouth 2 (two) times daily. 20 tablet Robyn Haber, MD   HYDROcodone-homatropine (HYDROMET) 5-1.5 MG/5ML syrup Take 5 mLs by mouth every 6 (six) hours as needed for cough. 60 mL Robyn Haber, MD     Controlled Substance Prescriptions [place] Controlled Substance Registry consulted? Not Applicable   Robyn Haber, MD 05/28/18 1030

## 2018-05-28 NOTE — ED Triage Notes (Signed)
Pt here with URI sx x 4 days

## 2019-05-06 HISTORY — PX: BREAST BIOPSY: SHX20

## 2019-05-12 DIAGNOSIS — N6310 Unspecified lump in the right breast, unspecified quadrant: Secondary | ICD-10-CM | POA: Diagnosis not present

## 2019-05-13 ENCOUNTER — Other Ambulatory Visit: Payer: Self-pay | Admitting: Obstetrics and Gynecology

## 2019-05-13 DIAGNOSIS — N6310 Unspecified lump in the right breast, unspecified quadrant: Secondary | ICD-10-CM

## 2019-05-19 ENCOUNTER — Other Ambulatory Visit: Payer: Self-pay | Admitting: Obstetrics and Gynecology

## 2019-05-19 ENCOUNTER — Other Ambulatory Visit: Payer: Self-pay

## 2019-05-19 ENCOUNTER — Ambulatory Visit
Admission: RE | Admit: 2019-05-19 | Discharge: 2019-05-19 | Disposition: A | Discharge status: Home / Self Care | Payer: 59 | Source: Ambulatory Visit | Attending: Obstetrics and Gynecology | Admitting: Obstetrics and Gynecology

## 2019-05-19 DIAGNOSIS — N6310 Unspecified lump in the right breast, unspecified quadrant: Secondary | ICD-10-CM

## 2019-05-19 DIAGNOSIS — R921 Mammographic calcification found on diagnostic imaging of breast: Secondary | ICD-10-CM

## 2019-05-19 DIAGNOSIS — R928 Other abnormal and inconclusive findings on diagnostic imaging of breast: Secondary | ICD-10-CM | POA: Diagnosis not present

## 2019-05-19 DIAGNOSIS — N6011 Diffuse cystic mastopathy of right breast: Secondary | ICD-10-CM | POA: Diagnosis not present

## 2019-05-27 ENCOUNTER — Ambulatory Visit
Admission: RE | Admit: 2019-05-27 | Discharge: 2019-05-27 | Disposition: A | Discharge status: Home / Self Care | Payer: 59 | Source: Ambulatory Visit | Attending: Obstetrics and Gynecology | Admitting: Obstetrics and Gynecology

## 2019-05-27 ENCOUNTER — Other Ambulatory Visit: Payer: Self-pay

## 2019-05-27 DIAGNOSIS — R921 Mammographic calcification found on diagnostic imaging of breast: Secondary | ICD-10-CM

## 2019-05-27 DIAGNOSIS — N641 Fat necrosis of breast: Secondary | ICD-10-CM | POA: Diagnosis not present

## 2019-05-27 DIAGNOSIS — N6312 Unspecified lump in the right breast, upper inner quadrant: Secondary | ICD-10-CM | POA: Diagnosis not present

## 2019-05-27 DIAGNOSIS — N6310 Unspecified lump in the right breast, unspecified quadrant: Secondary | ICD-10-CM

## 2019-06-24 DIAGNOSIS — Z01419 Encounter for gynecological examination (general) (routine) without abnormal findings: Secondary | ICD-10-CM | POA: Diagnosis not present

## 2019-06-24 DIAGNOSIS — N62 Hypertrophy of breast: Secondary | ICD-10-CM | POA: Diagnosis not present

## 2019-06-24 MED FILL — metroNIDAZOLE 500 MG TABS: 500 | 7 days supply | Qty: 14 | Fill #0

## 2019-08-05 MED FILL — metroNIDAZOLE 500 MG TABS: 500 | 7 days supply | Qty: 14 | Fill #0

## 2019-08-06 DIAGNOSIS — B369 Superficial mycosis, unspecified: Secondary | ICD-10-CM | POA: Diagnosis not present

## 2019-08-06 DIAGNOSIS — H538 Other visual disturbances: Secondary | ICD-10-CM | POA: Diagnosis not present

## 2019-08-06 DIAGNOSIS — F430 Acute stress reaction: Secondary | ICD-10-CM | POA: Diagnosis not present

## 2019-08-06 DIAGNOSIS — Z8632 Personal history of gestational diabetes: Secondary | ICD-10-CM | POA: Diagnosis not present

## 2019-08-06 MED FILL — FLUCONAZOLE 150 MG TABS: 150 | 28 days supply | Qty: 4 | Fill #0

## 2019-09-29 DIAGNOSIS — B360 Pityriasis versicolor: Secondary | ICD-10-CM | POA: Diagnosis not present

## 2019-09-29 MED FILL — SELENIUM SULF 2.5% LOTION: 2.5 | 30 days supply | Qty: 118 | Fill #0

## 2019-09-30 DIAGNOSIS — R739 Hyperglycemia, unspecified: Secondary | ICD-10-CM | POA: Diagnosis not present

## 2019-09-30 DIAGNOSIS — Z0000 Encounter for general adult medical examination without abnormal findings: Secondary | ICD-10-CM | POA: Diagnosis not present

## 2019-09-30 DIAGNOSIS — Z6841 Body Mass Index (BMI) 40.0 and over, adult: Secondary | ICD-10-CM | POA: Diagnosis not present

## 2019-09-30 DIAGNOSIS — Z13220 Encounter for screening for lipoid disorders: Secondary | ICD-10-CM | POA: Diagnosis not present

## 2019-11-10 DIAGNOSIS — N898 Other specified noninflammatory disorders of vagina: Secondary | ICD-10-CM | POA: Diagnosis not present

## 2019-11-10 DIAGNOSIS — N760 Acute vaginitis: Secondary | ICD-10-CM | POA: Diagnosis not present

## 2019-11-10 DIAGNOSIS — B9689 Other specified bacterial agents as the cause of diseases classified elsewhere: Secondary | ICD-10-CM | POA: Diagnosis not present

## 2019-12-01 ENCOUNTER — Other Ambulatory Visit: Payer: Self-pay

## 2019-12-01 ENCOUNTER — Ambulatory Visit (HOSPITAL_COMMUNITY)
Admission: RE | Admit: 2019-12-01 | Discharge: 2019-12-01 | Disposition: A | Discharge status: Home / Self Care | Payer: 59 | Source: Ambulatory Visit

## 2019-12-01 ENCOUNTER — Encounter (HOSPITAL_COMMUNITY): Payer: Self-pay

## 2019-12-01 VITALS — BP 157/96 | HR 101 | Temp 99.1°F | Resp 20

## 2019-12-01 DIAGNOSIS — J309 Allergic rhinitis, unspecified: Secondary | ICD-10-CM

## 2019-12-01 DIAGNOSIS — H6501 Acute serous otitis media, right ear: Secondary | ICD-10-CM | POA: Diagnosis not present

## 2019-12-01 MED ORDER — CETIRIZINE HCL 10 MG PO TABS: 10.0000 mg | ORAL_TABLET | Freq: Every day | ORAL | 0 refills | Status: AC

## 2019-12-01 MED ORDER — SALINE SPRAY 0.65 % NA SOLN: 1.0000 | NASAL | 0 refills | Status: AC | PRN

## 2019-12-01 MED ORDER — FLUTICASONE PROPIONATE 50 MCG/ACT NA SUSP: 1.0000 | Freq: Every day | NASAL | 0 refills | Status: AC

## 2019-12-01 MED FILL — FLUTICASONE PROP 50 MCG SPR: 50 | 60 days supply | Qty: 16 | Fill #0

## 2019-12-01 NOTE — ED Triage Notes (Signed)
Today is day 3 of feeling bad.  Sinus pressure.  Has been using neti pot, but no relief.  Forehead is hurting, teeth hurting on right side, right ear hurting.

## 2019-12-01 NOTE — Discharge Instructions (Addendum)
For the next 1week to 1 spray of Flonase in both nostrils twice a day and then once a day following 1 week Take Zyrtec daily Use the nasal saline spray throughout the day Take 2 regular strength ibuprofen every 6 hours until pain resolves Continue the Mucinex per package labeling May consider adding pseudoephedrine product over-the-counter for the next 1 to 2 days, follow package labeling   If not significantly improved over the next 2 to 3 days please follow-up with your primary care or this clinic.

## 2019-12-01 NOTE — ED Provider Notes (Signed)
[place]    CSN: [id number] Arrival date & time: 12/01/19  1552      History   Chief Complaint Chief Complaint  Patient presents with  . Appointment    4:00  . Otalgia    HPI Alexis Meyer is a 34 y.o. female.   Patient presents with 3 days of right-sided nasal congestion, sinus pressure and right ear pain.  Has tried Nettie pot and Mucinex without much relief.  No other symptoms of cough, sore throat, fever or chills.  Denies nausea, vomiting or diarrhea.  History of seasonal allergies.  Patient received both Covid vaccines.     Past Medical History:  Diagnosis Date  . Anxiety   . Bipolar disorder ([place])    has RX but doesn't take it  . Complication of anesthesia    slow to wake from surgery , extra groggy afterwards   . Depression   . GERD (gastroesophageal reflux disease)   . Herpes   . Ringworm of body   . Urinary tract infection    patient denies     Patient Active Problem List   Diagnosis Date Noted  . S/P hysterectomy 01/16/2018  . S/P cesarean section 12/04/2016  . Bipolar II disorder ([place]) 10/29/2013    Past Surgical History:  Procedure Laterality Date  . ABDOMINAL HYSTERECTOMY    . CESAREAN SECTION N/A 12/04/2016   Procedure: CESAREAN SECTION;  Surgeon: Christophe Louis, MD;  Location: [place];  Service: Obstetrics;  Laterality: N/A;  EDD 12/24/16  . FRACTURE SURGERY     left wrist  . ROBOT ASSISTED MYOMECTOMY N/A 05/26/2015   Procedure: ROBOTIC ASSISTED MYOMECTOMY;  Surgeon: Governor Specking, MD;  Location: [facility];  Service: Gynecology;  Laterality: N/A;  Dr. MODY will assist   . ROBOTIC ASSISTED TOTAL HYSTERECTOMY Bilateral 01/16/2018   Procedure: XI ROBOTIC ASSISTED TOTAL HYSTERECTOMY WITH BILATERAL SALPINGECTOMY;  Surgeon: Christophe Louis, MD;  Location: [facility];  Service: Gynecology;  Laterality: Bilateral;  . WRIST SURGERY      OB History    Gravida  4   Para  1   Term      Preterm  1   AB  2   Living  1     SAB    2   TAB      Ectopic      Multiple  0   Live Births  1            Home Medications    Prior to Admission medications   Medication Sig Start Date End Date Taking? Authorizing Provider  guaiFENesin (MUCINEX) 600 MG 12 hr tablet Take by mouth 2 (two) times daily.   Yes [provider]  amoxicillin (AMOXIL) 875 MG tablet Take 1 tablet (875 mg total) by mouth 2 (two) times daily. 05/28/18   Robyn Haber, MD  cetirizine (ZYRTEC ALLERGY) 10 MG tablet Take 1 tablet (10 mg total) by mouth daily. 12/01/19   Breyton Vanscyoc, Marguerita Beards, PA-C  fluticasone (FLONASE) 50 MCG/ACT nasal spray Place 1 spray into both nostrils daily. 12/01/19   Yacoub Diltz, Marguerita Beards, PA-C  HYDROcodone-homatropine (HYDROMET) 5-1.5 MG/5ML syrup Take 5 mLs by mouth every 6 (six) hours as needed for cough. 05/28/18   Robyn Haber, MD  ibuprofen (ADVIL,MOTRIN) 800 MG tablet Take 1 tablet (800 mg total) by mouth every 8 (eight) hours as needed (mild pain). 01/17/18   Christophe Louis, MD  sodium chloride (OCEAN) 0.65 % SOLN nasal spray Place 1 spray  into both nostrils as needed for congestion. 12/01/19   Lem Peary, Marguerita Beards, PA-C    Family History Family History  Adopted: Yes  Problem Relation Age of Onset  . Cancer Mother   . Cancer Father     Social History Social History   Tobacco Use  . Smoking status: Never Smoker  . Smokeless tobacco: Never Used  Substance Use Topics  . Alcohol use: No  . Drug use: No     Allergies   Nickel   Review of Systems Review of Systems   Physical Exam Triage Vital Signs ED Triage Vitals  Enc Vitals Group     BP 12/01/19 1619 (!) 157/96     Pulse Rate 12/01/19 1619 (!) 101     Resp 12/01/19 1619 20     Temp 12/01/19 1619 99.1 F (37.3 C)     Temp Source 12/01/19 1619 Oral     SpO2 12/01/19 1619 96 %     Weight --      Height --      Head Circumference --      Peak Flow --      Pain Score 12/01/19 1615 4     Pain Loc --      Pain Edu? --      Excl. in [place]? --    No data  found.  Updated Vital Signs BP (!) 157/96 (BP Location: Right Arm) Comment (BP Location): large cuff  Pulse (!) 101   Temp 99.1 F (37.3 C) (Oral)   Resp 20   LMP 12/28/2017   SpO2 96%   Visual Acuity Right Eye Distance:   Left Eye Distance:   Bilateral Distance:    Right Eye Near:   Left Eye Near:    Bilateral Near:     Physical Exam Vitals and nursing note reviewed.  Constitutional:      General: She is not in acute distress.    Appearance: She is well-developed.  HENT:     Head: Normocephalic and atraumatic.     Ears:     Comments: Right TM with serous fluid    Nose:     Comments: Pale boggy turbinates, right >L    Mouth/Throat:     Mouth: Mucous membranes are moist.     Comments: Some postnasal drip visible with cobblestoning Eyes:     Extraocular Movements: Extraocular movements intact.     Conjunctiva/sclera: Conjunctivae normal.     Pupils: Pupils are equal, round, and reactive to light.  Cardiovascular:     Rate and Rhythm: Normal rate and regular rhythm.     Heart sounds: No murmur heard.   Pulmonary:     Effort: Pulmonary effort is normal. No respiratory distress.     Breath sounds: Normal breath sounds.  Abdominal:     Palpations: Abdomen is soft.     Tenderness: There is no abdominal tenderness.  Musculoskeletal:     Cervical back: Neck supple.  Skin:    General: Skin is warm and dry.  Neurological:     Mental Status: She is alert.      UC Treatments / Results  Labs (all labs ordered are listed, but only abnormal results are displayed) Labs Reviewed - No data to display  EKG   Radiology No results found.  Procedures Procedures (including critical care time)  Medications Ordered in UC Medications - No data to display  Initial Impression / Assessment and Plan / UC Course  I have reviewed the triage  vital signs and the nursing notes.  Pertinent labs & imaging results that were available during my care of the patient were reviewed  by me and considered in my medical decision making (see chart for details).     #Allergic rhinitis #Acute serous otitis media right side Patient is a 34 year old presenting with acute right-sided serous otitis likely secondary to nasal congestion driven by allergic rhinitis.  No evidence of infection today.  We will recommend Flonase, Zyrtec short course of decongestant.  Recommend continue Mucinex.  Recommend to hydrate well.  Return and follow-up precautions discussed. Final Clinical Impressions(s) / UC Diagnoses   Final diagnoses:  Right acute serous otitis media, recurrence not specified  Allergic rhinitis, unspecified seasonality, unspecified trigger     Discharge Instructions     For the next 1week to 1 spray of Flonase in both nostrils twice a day and then once a day following 1 week Take Zyrtec daily Use the nasal saline spray throughout the day Take 2 regular strength ibuprofen every 6 hours until pain resolves Continue the Mucinex per package labeling May consider adding pseudoephedrine product over-the-counter for the next 1 to 2 days, follow package labeling   If not significantly improved over the next 2 to 3 days please follow-up with your primary care or this clinic.      ED Prescriptions    Medication Sig Dispense Auth. Provider   fluticasone (FLONASE) 50 MCG/ACT nasal spray Place 1 spray into both nostrils daily. 11.1 mL Jacoby Zanni, Marguerita Beards, PA-C   cetirizine (ZYRTEC ALLERGY) 10 MG tablet Take 1 tablet (10 mg total) by mouth daily. 30 tablet Arriona Prest, Marguerita Beards, PA-C   sodium chloride (OCEAN) 0.65 % SOLN nasal spray Place 1 spray into both nostrils as needed for congestion. 44 mL Holt Woolbright, Marguerita Beards, PA-C     PDMP not reviewed this encounter.   Purnell Shoemaker, PA-C 12/02/19 [phone]

## 2019-12-29 ENCOUNTER — Other Ambulatory Visit: Payer: Self-pay | Admitting: Obstetrics and Gynecology

## 2019-12-29 DIAGNOSIS — N6489 Other specified disorders of breast: Secondary | ICD-10-CM

## 2020-01-20 ENCOUNTER — Ambulatory Visit
Admission: RE | Admit: 2020-01-20 | Discharge: 2020-01-20 | Disposition: A | Discharge status: Home / Self Care | Payer: 59 | Source: Ambulatory Visit | Attending: Obstetrics and Gynecology | Admitting: Obstetrics and Gynecology

## 2020-01-20 ENCOUNTER — Other Ambulatory Visit: Payer: Self-pay

## 2020-01-20 ENCOUNTER — Other Ambulatory Visit: Payer: Self-pay | Admitting: Obstetrics and Gynecology

## 2020-01-20 DIAGNOSIS — N6489 Other specified disorders of breast: Secondary | ICD-10-CM | POA: Diagnosis not present

## 2020-01-20 DIAGNOSIS — N6311 Unspecified lump in the right breast, upper outer quadrant: Secondary | ICD-10-CM | POA: Diagnosis not present

## 2020-01-20 DIAGNOSIS — R599 Enlarged lymph nodes, unspecified: Secondary | ICD-10-CM

## 2020-01-30 ENCOUNTER — Other Ambulatory Visit (HOSPITAL_COMMUNITY)
Admission: RE | Admit: 2020-01-30 | Discharge: 2020-01-30 | Disposition: A | Discharge status: Home / Self Care | Payer: 59 | Source: Ambulatory Visit | Attending: Obstetrics and Gynecology | Admitting: Obstetrics and Gynecology

## 2020-01-30 ENCOUNTER — Other Ambulatory Visit: Payer: Self-pay

## 2020-01-30 ENCOUNTER — Ambulatory Visit
Admission: RE | Admit: 2020-01-30 | Discharge: 2020-01-30 | Disposition: A | Discharge status: Home / Self Care | Payer: 59 | Source: Ambulatory Visit | Attending: Obstetrics and Gynecology | Admitting: Obstetrics and Gynecology

## 2020-01-30 DIAGNOSIS — R599 Enlarged lymph nodes, unspecified: Secondary | ICD-10-CM | POA: Diagnosis not present

## 2020-01-30 DIAGNOSIS — R590 Localized enlarged lymph nodes: Secondary | ICD-10-CM | POA: Diagnosis not present

## 2020-02-03 LAB — SURGICAL PATHOLOGY

## 2020-06-28 DIAGNOSIS — N644 Mastodynia: Secondary | ICD-10-CM | POA: Diagnosis not present

## 2020-06-28 DIAGNOSIS — Z01419 Encounter for gynecological examination (general) (routine) without abnormal findings: Secondary | ICD-10-CM | POA: Diagnosis not present

## 2020-06-28 DIAGNOSIS — N898 Other specified noninflammatory disorders of vagina: Secondary | ICD-10-CM | POA: Diagnosis not present

## 2021-09-12 IMAGING — MG MM BREAST BX W/ LOC DEV 1ST LESION IMAGE BX SPEC STEREO GUIDE*R*
9 of 13 series · 9 of 21 positions shown · non-contrast
Comparison: Previous exam(s).

CLINICAL DATA: Right breast mass.

EXAM:
ULTRASOUND GUIDED RIGHT BREAST CORE NEEDLE BIOPSY

[R (1 of 6)]
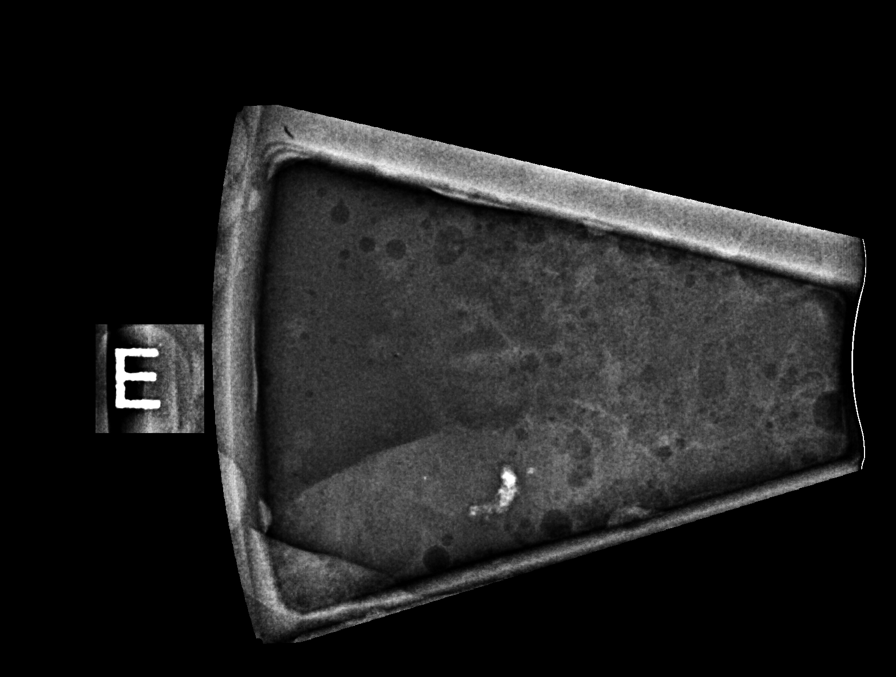

[R (2 of 6)]
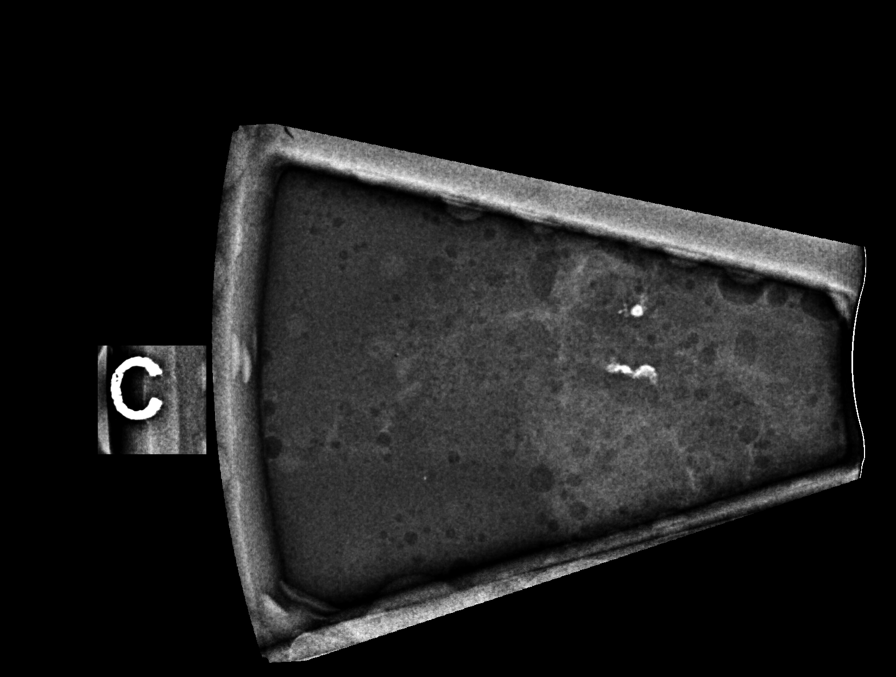

[R (3 of 6)]
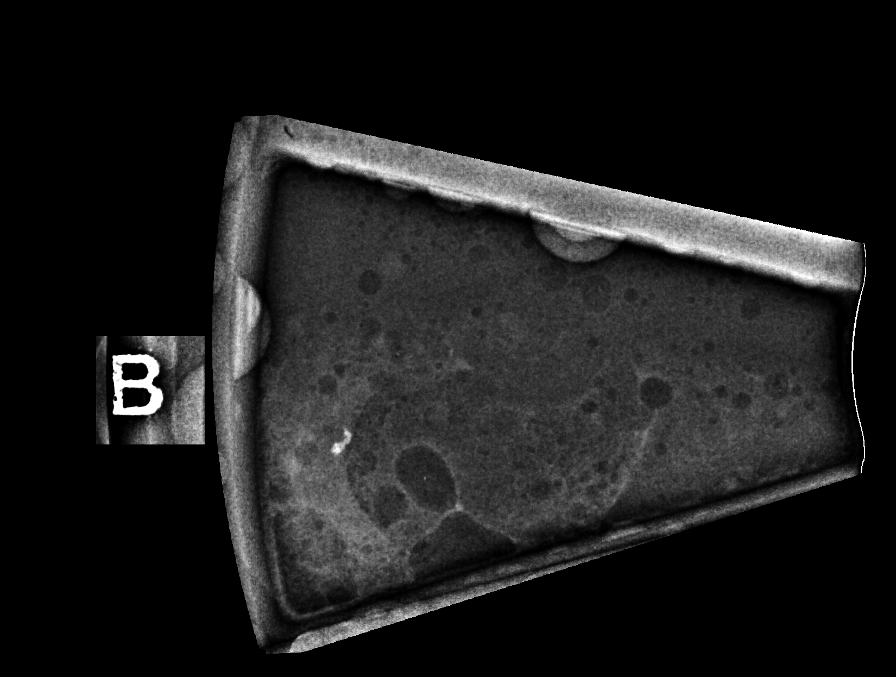

[R (4 of 6)]
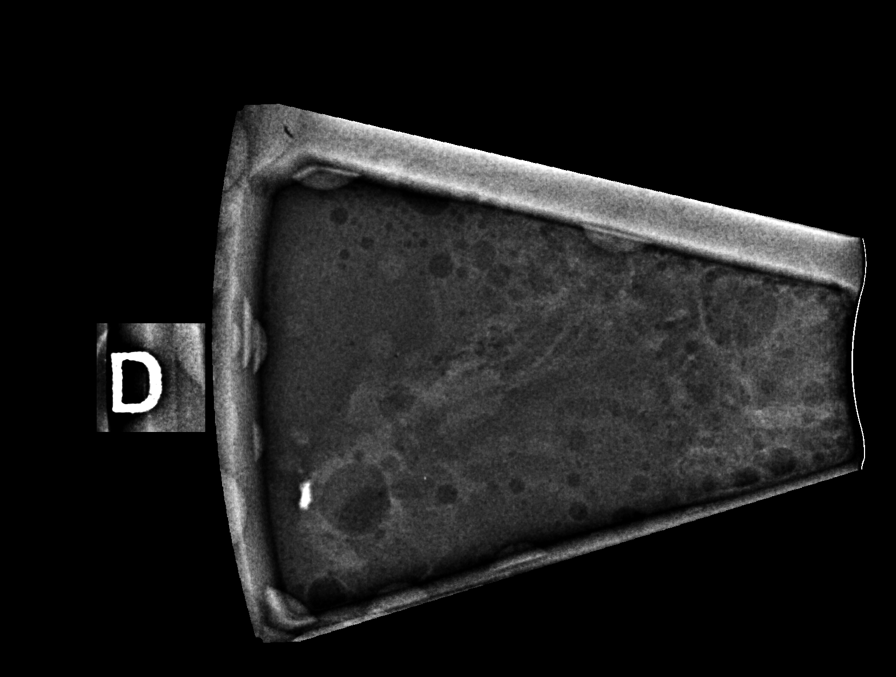

[R (5 of 6)]
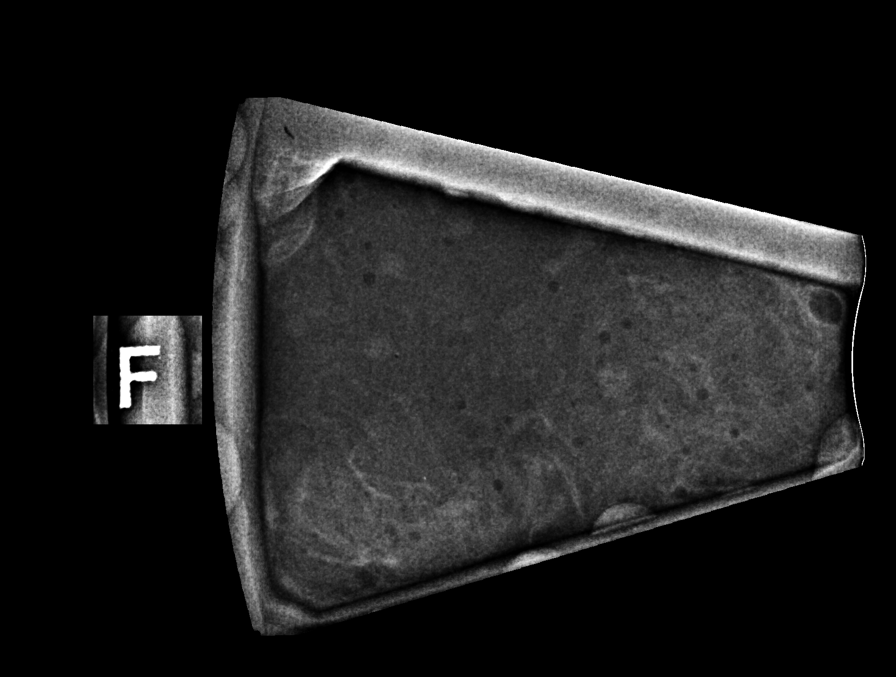

[R (6 of 6)]
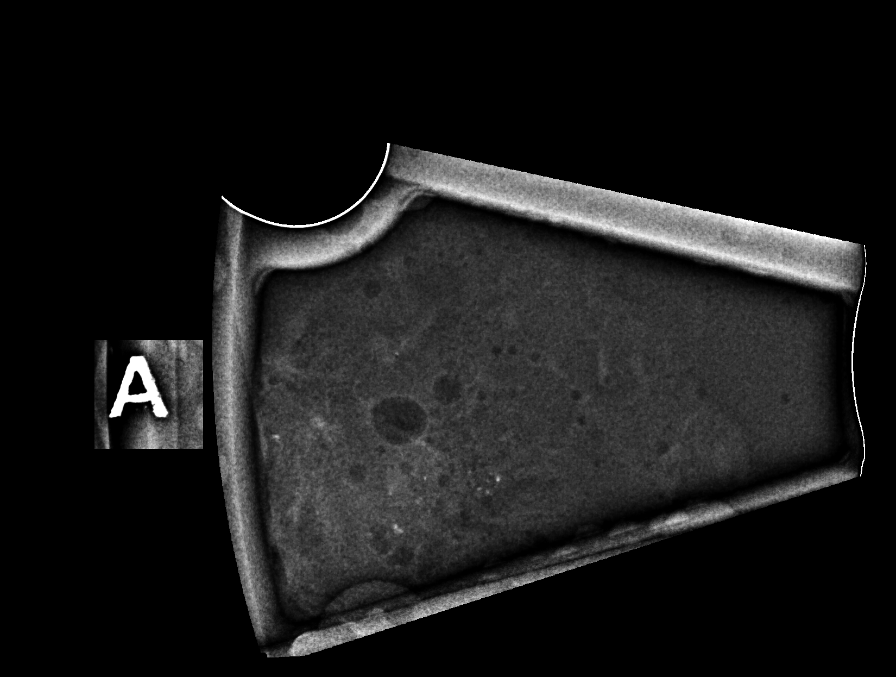

[R CC (1 of 3)]
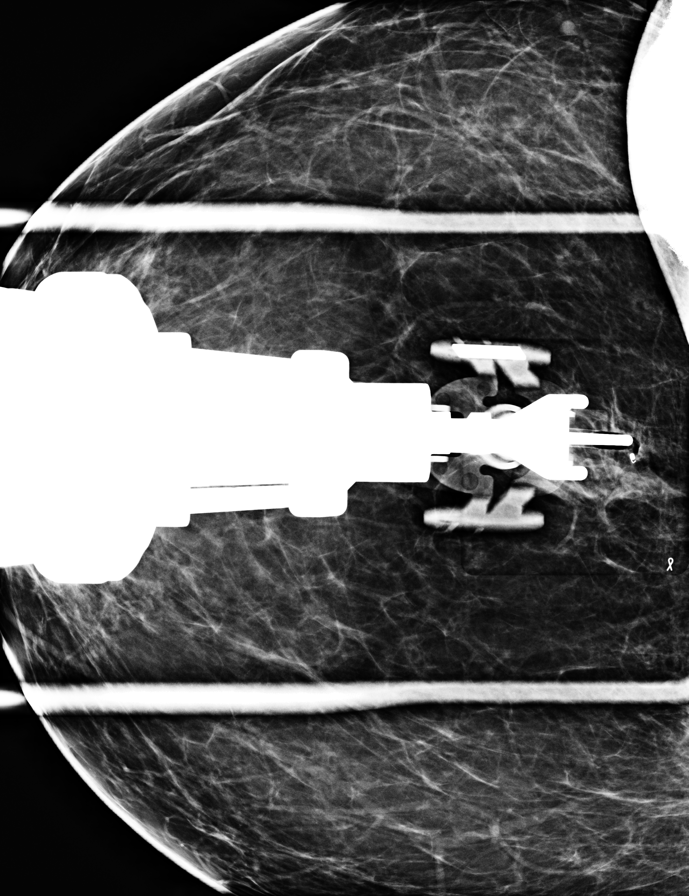

[R CC (2 of 3)]
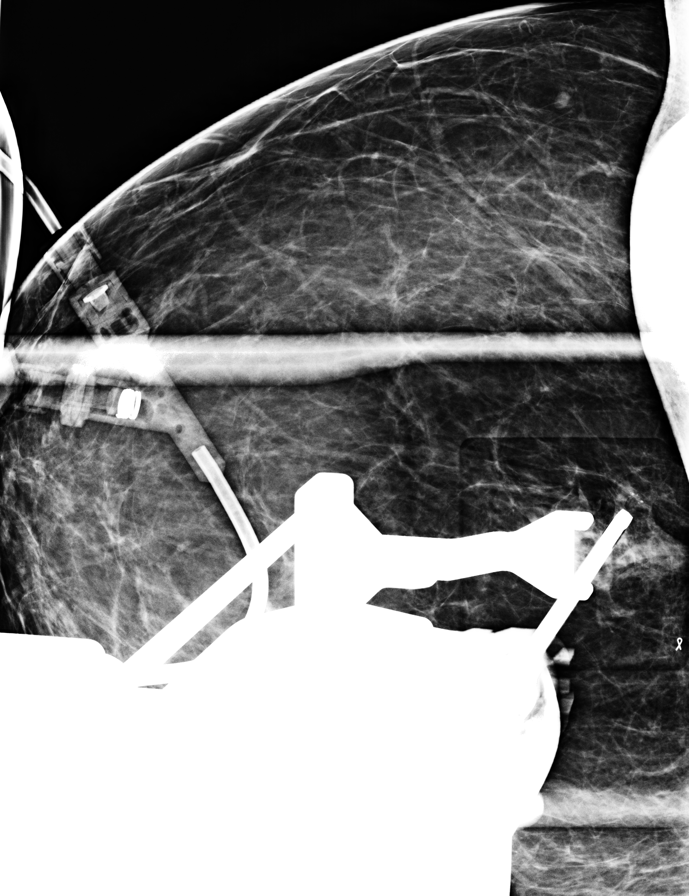

[R CC (3 of 3)]
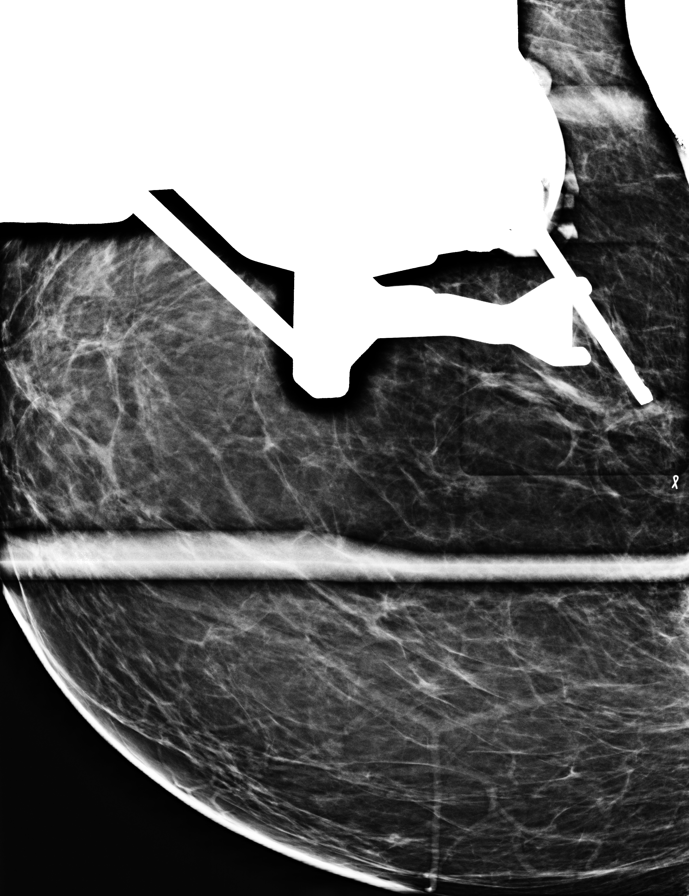

[9 of 21 positions shown; findings below may reference images not displayed]



Lesion quadrant: [DATE]

Using sterile technique and 1% lidocaine and 1% lidocaine with
epinephrine as local anesthetic, under direct ultrasound
visualization, a 12 gauge [place] device was used to perform
biopsy of a mass in the [DATE] region of the right breast using a
medial to lateral approach. At the conclusion of the procedure
ribbon shaped tissue marker clip was deployed into the biopsy
cavity. Follow up 2 view mammogram was performed and dictated
separately.
IMPRESSION: Ultrasound guided biopsy of the right breast. No apparent
complications.

## 2022-10-16 ENCOUNTER — Other Ambulatory Visit: Payer: Self-pay | Admitting: *Deleted
